# Patient Record
Sex: Male | Born: 1965 | Hispanic: No | Marital: Married | Smoking: Never smoker
Health system: Southern US, Community
[De-identification: ages and names within clinical notes are randomized; demographics above are authoritative.]

## PROBLEM LIST (undated history)

## (undated) DIAGNOSIS — T7840XA Allergy, unspecified, initial encounter: Secondary | ICD-10-CM

## (undated) HISTORY — DX: Allergy, unspecified, initial encounter: T78.40XA

---

## 1998-07-12 ENCOUNTER — Other Ambulatory Visit: Admission: RE | Admit: 1998-07-12 | Discharge: 1998-07-12 | Payer: Self-pay | Admitting: Internal Medicine

## 2005-07-24 ENCOUNTER — Ambulatory Visit (HOSPITAL_COMMUNITY): Admission: RE | Admit: 2005-07-24 | Discharge: 2005-07-24 | Payer: Self-pay | Admitting: Internal Medicine

## 2005-07-24 ENCOUNTER — Encounter (INDEPENDENT_AMBULATORY_CARE_PROVIDER_SITE_OTHER): Payer: Self-pay | Admitting: *Deleted

## 2006-08-11 IMAGING — US US BIOPSY
1 series · 12 of 12 positions shown · non-contrast
Comparison: none

CLINICAL DATA: ULTRASOUND-GUIDED THYROID BIOPSY:
An ultrasound-guided thyroid biopsy was thoroughly discussed with the patient and questions were answered.  Risks and benefits of the procedure were also delineated.  Risks specifically discussed included bleeding, bruising, infection and risk of injury to adjacent blood vessels and nerves. The patient understands and wishes to proceed.  Verbal and written consent was obtained.  
After the patient was prepped and draped in the normal sterile fashion 1% lidocaine was used for local anesthesia.  Using direct ultrasound guidance four passes were made using a 25 gauge hypodermic needle into the largest nodule within the right lobe of the thyroid.  The specimens were given to cytology for further analysis.  The patient tolerated the procedure well and there were no immediate complications.  No hematoma was identified postprocedure.

[Series 1: unknown · 0.09mm/px · 12 of 12 slices shown]
[im 1/12]
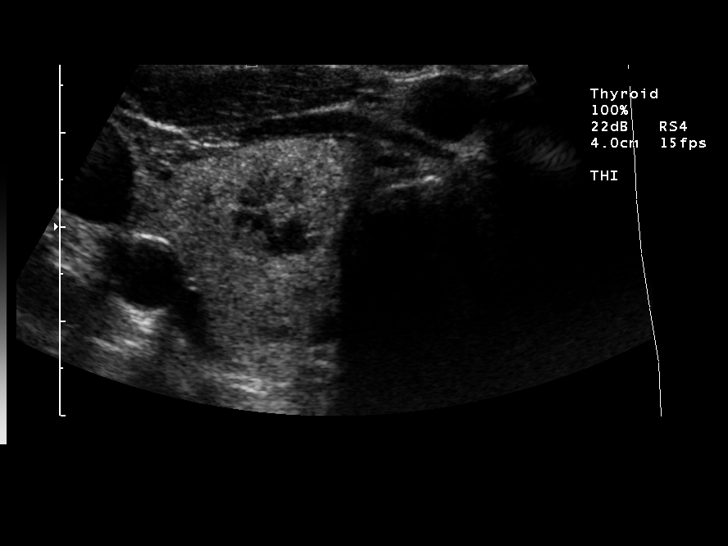
[im 2/12]
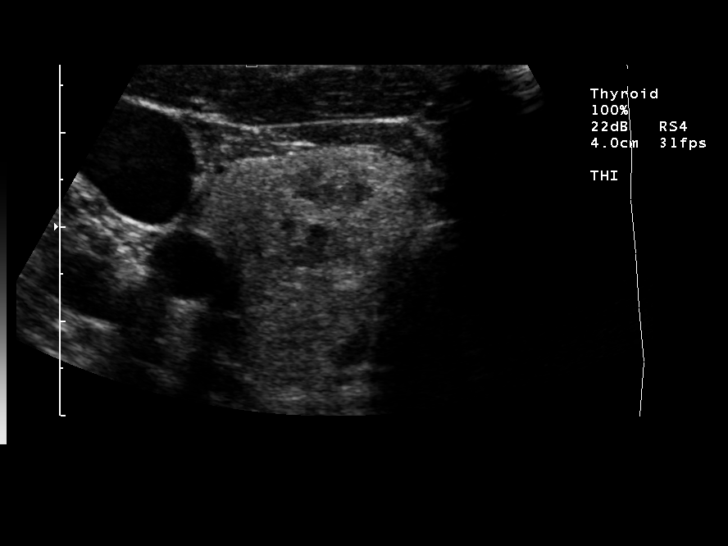
[im 3/12]
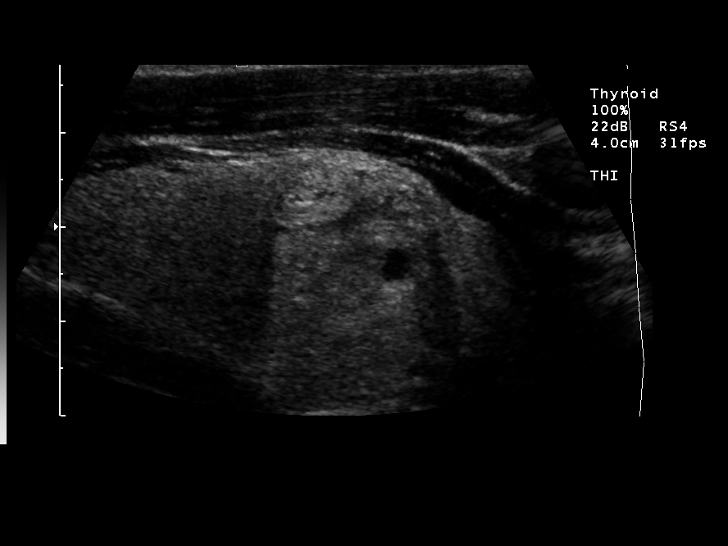
[im 4/12]
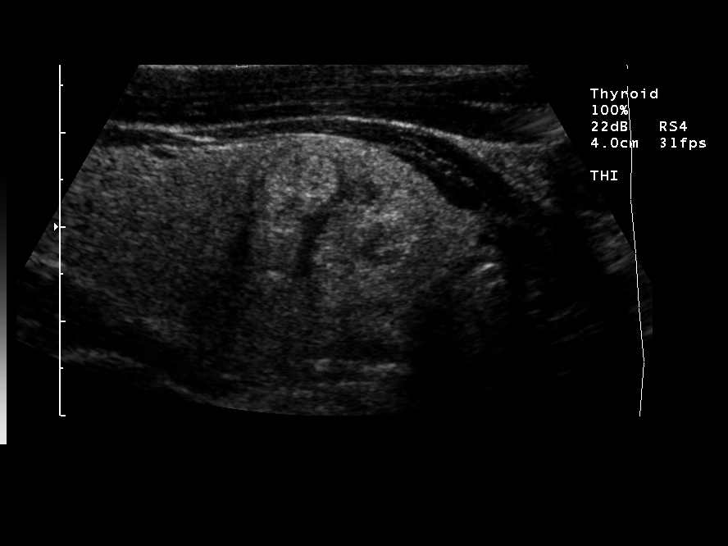
[im 5/12]
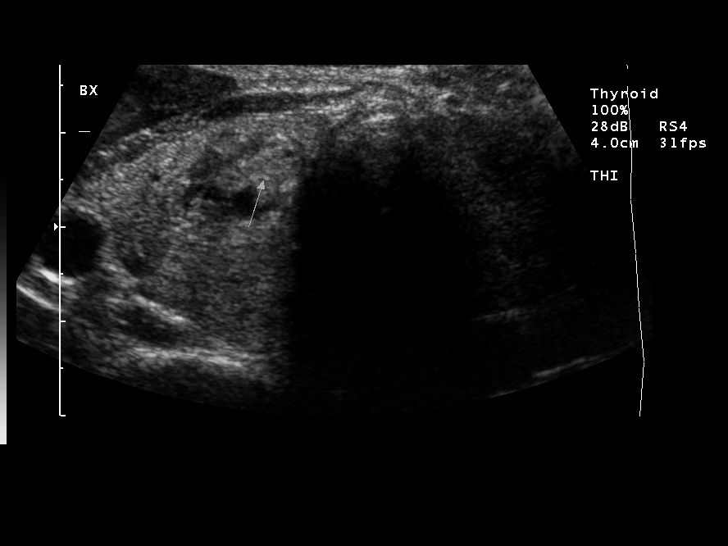
[im 6/12]
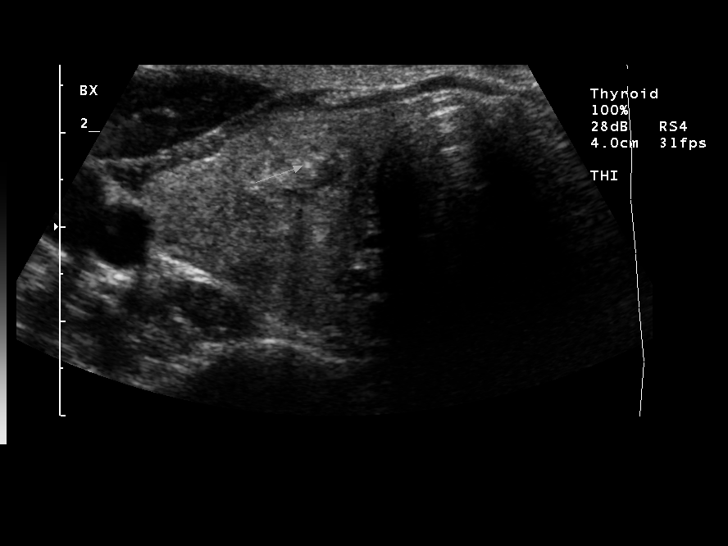
[im 7/12]
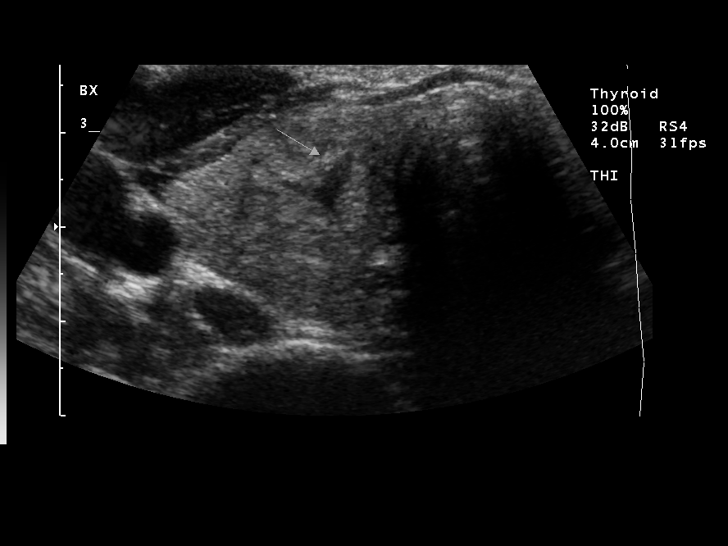
[im 8/12]
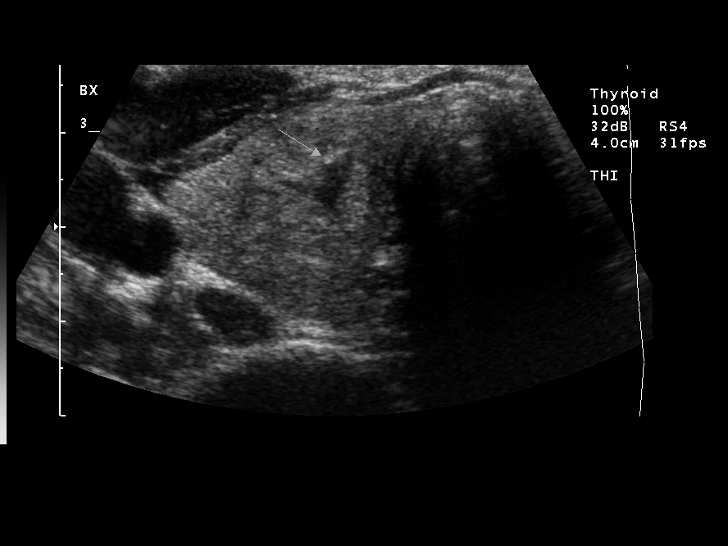
[im 9/12]
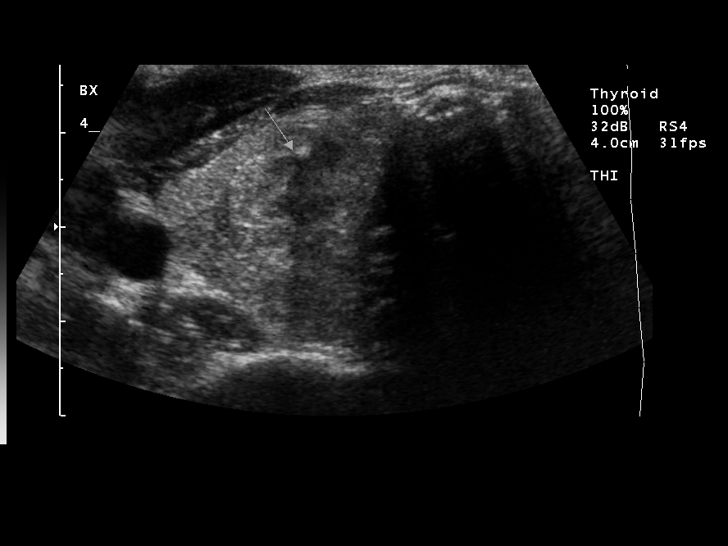
[im 10/12]
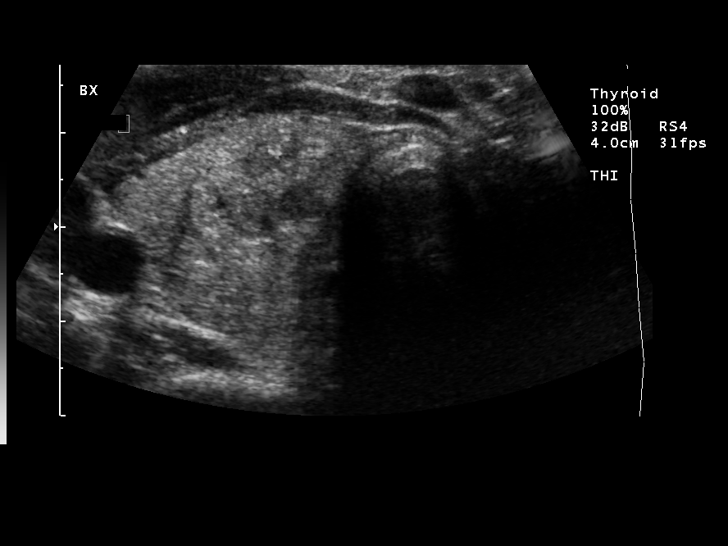
[im 11/12]
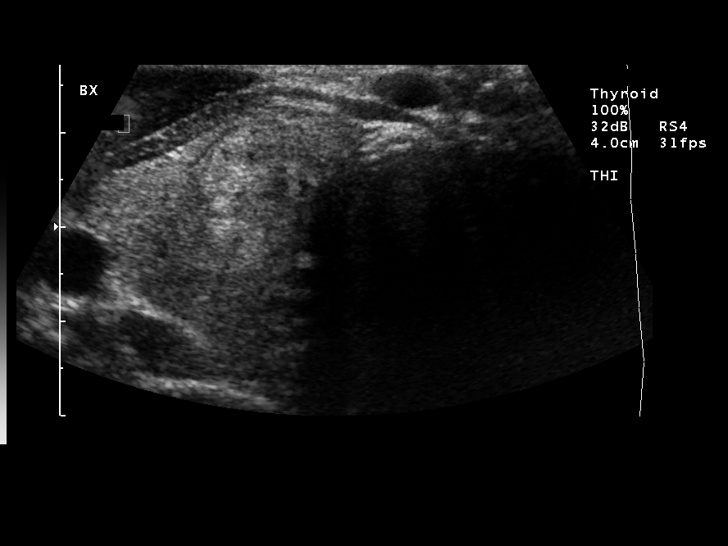
[im 12/12]
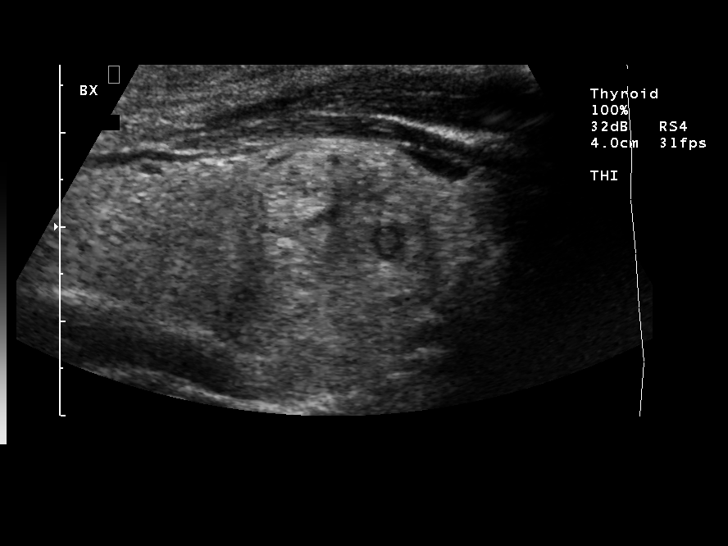

[12 of 12 positions shown; findings below may reference images not displayed]

IMPRESSION: Successful ultrasound-guided fine needle aspiration of the right lobe of the thyroid as discussed above.

## 2007-08-26 ENCOUNTER — Ambulatory Visit: Payer: Self-pay | Admitting: Internal Medicine

## 2007-08-26 LAB — CONVERTED CEMR LAB
Albumin: 3.8 g/dL (ref 3.5–5.2)
Alkaline Phosphatase: 72 units/L (ref 39–117)
Basophils Absolute: 0.1 10*3/uL (ref 0.0–0.1)
Bilirubin, Direct: 0.1 mg/dL (ref 0.0–0.3)
CO2: 29 meq/L (ref 19–32)
Calcium: 9.1 mg/dL (ref 8.4–10.5)
Chloride: 108 meq/L (ref 96–112)
Cortisol, Plasma: 4.8 ug/dL
Creatinine, Ser: 1.1 mg/dL (ref 0.4–1.5)
Eosinophils Absolute: 1 10*3/uL — ABNORMAL HIGH (ref 0.0–0.6)
Eosinophils Relative: 15.4 % — ABNORMAL HIGH (ref 0.0–5.0)
GFR calc Af Amer: 95 mL/min
Hemoglobin: 14.2 g/dL (ref 13.0–17.0)
Ketones, ur: NEGATIVE mg/dL
Leukocytes, UA: NEGATIVE
Lymphocytes Relative: 34.9 % (ref 12.0–46.0)
MCHC: 35.1 g/dL (ref 30.0–36.0)
MCV: 86.4 fL (ref 78.0–100.0)
Monocytes Absolute: 0.3 10*3/uL (ref 0.2–0.7)
Neutro Abs: 2.9 10*3/uL (ref 1.4–7.7)
Neutrophils Relative %: 43.7 % (ref 43.0–77.0)
Nitrite: NEGATIVE
Platelets: 186 10*3/uL (ref 150–400)
RBC: 4.67 M/uL (ref 4.22–5.81)
Sed Rate: 11 mm/hr (ref 0–20)
TSH: 0.58 microintl units/mL (ref 0.35–5.50)
Urine Glucose: 100 mg/dL — AB
Urobilinogen, UA: 0.2 (ref 0.0–1.0)
pH: 5.5 (ref 5.0–8.0)

## 2007-10-28 ENCOUNTER — Encounter: Payer: Self-pay | Admitting: Internal Medicine

## 2007-11-25 ENCOUNTER — Encounter (INDEPENDENT_AMBULATORY_CARE_PROVIDER_SITE_OTHER): Payer: Self-pay | Admitting: *Deleted

## 2007-11-25 ENCOUNTER — Ambulatory Visit: Payer: Self-pay | Admitting: Internal Medicine

## 2007-11-25 DIAGNOSIS — M549 Dorsalgia, unspecified: Secondary | ICD-10-CM | POA: Insufficient documentation

## 2007-11-25 DIAGNOSIS — H547 Unspecified visual loss: Secondary | ICD-10-CM

## 2007-11-25 DIAGNOSIS — R7989 Other specified abnormal findings of blood chemistry: Secondary | ICD-10-CM | POA: Insufficient documentation

## 2007-11-26 LAB — CONVERTED CEMR LAB
Basophils Absolute: 0.1 10*3/uL (ref 0.0–0.1)
Basophils Relative: 1.1 % — ABNORMAL HIGH (ref 0.0–1.0)
Eosinophils Absolute: 0.4 10*3/uL (ref 0.0–0.6)
Eosinophils Relative: 6.1 % — ABNORMAL HIGH (ref 0.0–5.0)
Monocytes Absolute: 0.3 10*3/uL (ref 0.2–0.7)
Neutro Abs: 3.4 10*3/uL (ref 1.4–7.7)
Platelets: 199 10*3/uL (ref 150–400)

## 2007-11-28 ENCOUNTER — Encounter: Payer: Self-pay | Admitting: Internal Medicine

## 2007-11-28 DIAGNOSIS — E559 Vitamin D deficiency, unspecified: Secondary | ICD-10-CM

## 2007-11-30 ENCOUNTER — Encounter: Payer: Self-pay | Admitting: Internal Medicine

## 2008-03-31 ENCOUNTER — Telehealth: Payer: Self-pay | Admitting: Internal Medicine

## 2008-03-31 ENCOUNTER — Ambulatory Visit: Payer: Self-pay | Admitting: Internal Medicine

## 2008-03-31 DIAGNOSIS — M79609 Pain in unspecified limb: Secondary | ICD-10-CM

## 2008-03-31 DIAGNOSIS — R071 Chest pain on breathing: Secondary | ICD-10-CM | POA: Insufficient documentation

## 2008-04-22 DIAGNOSIS — E042 Nontoxic multinodular goiter: Secondary | ICD-10-CM | POA: Insufficient documentation

## 2008-09-05 ENCOUNTER — Ambulatory Visit: Payer: Self-pay | Admitting: Internal Medicine

## 2008-09-05 LAB — CONVERTED CEMR LAB
ALT: 28 units/L (ref 0–53)
AST: 26 units/L (ref 0–37)
BUN: 17 mg/dL (ref 6–23)
Bilirubin, Direct: 0.1 mg/dL (ref 0.0–0.3)
Calcium: 9.2 mg/dL (ref 8.4–10.5)
Chloride: 112 meq/L (ref 96–112)
Cholesterol: 176 mg/dL (ref 0–200)
Eosinophils Relative: 7 % — ABNORMAL HIGH (ref 0.0–5.0)
HCT: 42.9 % (ref 39.0–52.0)
Hemoglobin: 15 g/dL (ref 13.0–17.0)
LDL Cholesterol: 129 mg/dL — ABNORMAL HIGH (ref 0–99)
Leukocytes, UA: NEGATIVE
MCHC: 35.1 g/dL (ref 30.0–36.0)
MCV: 88 fL (ref 78.0–100.0)
Monocytes Relative: 6 % (ref 3.0–12.0)
Nitrite: NEGATIVE
Platelets: 192 10*3/uL (ref 150–400)
RBC: 4.87 M/uL (ref 4.22–5.81)
RDW: 12.5 % (ref 11.5–14.6)
Sodium: 143 meq/L (ref 135–145)
Specific Gravity, Urine: 1.025 (ref 1.000–1.03)
Total CHOL/HDL Ratio: 5.4
Total Protein, Urine: NEGATIVE mg/dL
Total Protein: 7.2 g/dL (ref 6.0–8.3)
Urobilinogen, UA: 0.2 (ref 0.0–1.0)
WBC: 5.2 10*3/uL (ref 4.5–10.5)

## 2008-09-09 ENCOUNTER — Ambulatory Visit: Payer: Self-pay | Admitting: Internal Medicine

## 2008-09-28 ENCOUNTER — Ambulatory Visit: Payer: Self-pay | Admitting: Internal Medicine

## 2008-09-28 DIAGNOSIS — M25519 Pain in unspecified shoulder: Secondary | ICD-10-CM | POA: Insufficient documentation

## 2008-09-28 DIAGNOSIS — J069 Acute upper respiratory infection, unspecified: Secondary | ICD-10-CM | POA: Insufficient documentation

## 2008-10-19 ENCOUNTER — Ambulatory Visit: Payer: Self-pay | Admitting: Internal Medicine

## 2009-11-10 ENCOUNTER — Ambulatory Visit: Payer: Self-pay | Admitting: Internal Medicine

## 2009-11-10 LAB — CONVERTED CEMR LAB
AST: 34 units/L (ref 0–37)
Albumin: 4.1 g/dL (ref 3.5–5.2)
Alkaline Phosphatase: 71 units/L (ref 39–117)
CO2: 30 meq/L (ref 19–32)
Chloride: 107 meq/L (ref 96–112)
Cholesterol: 207 mg/dL — ABNORMAL HIGH (ref 0–200)
Creatinine, Ser: 0.9 mg/dL (ref 0.4–1.5)
HDL: 42 mg/dL (ref >39.00)
Hemoglobin: 15.3 g/dL (ref 13.0–17.0)
Ketones, ur: NEGATIVE mg/dL
Leukocytes, UA: NEGATIVE
MCHC: 34.3 g/dL (ref 30.0–36.0)
MCV: 90.4 fL (ref 78.0–100.0)
Monocytes Absolute: 0.3 10*3/uL (ref 0.1–1.0)
Neutrophils Relative %: 56.6 % (ref 43.0–77.0)
Platelets: 181 10*3/uL (ref 150.0–400.0)
Potassium: 4.1 meq/L (ref 3.5–5.1)
RBC: 4.94 M/uL (ref 4.22–5.81)
RDW: 12.3 % (ref 11.5–14.6)
Sodium: 141 meq/L (ref 135–145)
Specific Gravity, Urine: 1.01 (ref 1.000–1.030)
TSH: 1.12 microintl units/mL (ref 0.35–5.50)
Total Bilirubin: 1.3 mg/dL — ABNORMAL HIGH (ref 0.3–1.2)
Total CHOL/HDL Ratio: 5
Urine Glucose: NEGATIVE mg/dL
pH: 7 (ref 5.0–8.0)

## 2009-11-14 ENCOUNTER — Ambulatory Visit: Payer: Self-pay | Admitting: Internal Medicine

## 2010-10-25 ENCOUNTER — Encounter: Payer: Self-pay | Admitting: Internal Medicine

## 2010-11-01 ENCOUNTER — Encounter: Payer: Self-pay | Admitting: Internal Medicine

## 2011-01-20 LAB — CONVERTED CEMR LAB: LDL Goal: 160 mg/dL

## 2011-01-22 NOTE — Letter (Signed)
Summary: [facility]   Imported By: Sherian Rein 11/28/2010 11:21:20  _____________________________________________________________________  External Attachment:    Type:   Image     Comment:   External Document

## 2011-05-07 NOTE — Assessment & Plan Note (Signed)
[facility]                           PRIMARY CARE OFFICE NOTE   NAME:Andrew Salazar, Andrew Salazar                      MRN:          [id number]  DATE:08/26/2007                            DOB:          02-10-1966    The patient is a 45 year old male who presents for a wellness  examination. He is a new patient. He complains of feeling tired of one  month duration since the time he came back from [place] where he spent  about a month.   ALLERGIES:  None.   CURRENT MEDICATIONS:  None.   FAMILY HISTORY:  Father died with prostate cancer in his 72s, mother is  living. No history of diabetes in the family.   SOCIAL HISTORY:  He is married with 2 children, ages 79 daughter and 4  son. He is an Chief Financial Officer working first shift.   REVIEW OF SYSTEMS:  Tired, fatigued, generalized ache for a month. No  diarrhea or blood in the stool. No leg swelling, chest pain or cough.  The rest of his 18-point review of systems is negative.   PHYSICAL EXAMINATION:  VITAL SIGNS:  Blood pressure 97/62, pulse 58,  temperature 97.8, weight 161 pounds.  GENERAL:  He is in no acute distress, looks well.  HEENT:  Moist mucosa.  NECK:  Supple. No thyromegaly or bruit.  LUNGS:  Clear to auscultation and percussion. No wheezes or rales.  HEART:  S1, S2, no murmur, no gallop.  ABDOMEN:  Soft, nontender, no organomegaly or mass felt.  EXTREMITIES:  Lower extremity without edema.  NEUROLOGIC:  He is alert, oriented and cooperative, denies being  depressed.  PROSTATE:  Normal prostate. Stool guaiac negative. No masses.   EKG with normal sinus rhythm.   ASSESSMENT/PLAN:  1. Normal wellness examination. Age/health-related issues discussed.      Healthy lifestyle discussed. Obtain blood work appropriate for age      with a PSA test. All vaccinations are up-to-date per patient.  2. Fatigue and arthralgias of unclear etiology. Will need to rule out      a general illness including  paracytic disease. Obtain the usual      blood work and chest x-ray.  3. Family history of prostate cancer. Plan as above.  4. Osteoarthritis. Vitamin D3 1000 units daily.   I will see him back in 2 months. He will call me if there are any  problems.     Georgina Quint. Plotnikov, MD  Electronically Signed    AVP/MedQ  DD: 08/31/2007  DT: 09/01/2007  Job #: [id number]

## 2011-05-17 ENCOUNTER — Ambulatory Visit (INDEPENDENT_AMBULATORY_CARE_PROVIDER_SITE_OTHER): Payer: Managed Care, Other (non HMO) | Admitting: Internal Medicine

## 2011-05-17 ENCOUNTER — Encounter: Payer: Self-pay | Admitting: Internal Medicine

## 2011-05-17 ENCOUNTER — Ambulatory Visit: Admission: RE | Admit: 2011-05-17 | Payer: Managed Care, Other (non HMO) | Source: Ambulatory Visit

## 2011-05-17 VITALS — BP 120/76 | HR 76 | Temp 98.4°F | Resp 16 | Ht 66.5 in | Wt 164.0 lb

## 2011-05-17 DIAGNOSIS — J189 Pneumonia, unspecified organism: Secondary | ICD-10-CM

## 2011-05-17 MED ORDER — PROMETHAZINE-CODEINE 6.25-10 MG/5ML PO SYRP: 5.0000 mL | ORAL_SOLUTION | ORAL | Status: AC | PRN

## 2011-05-17 MED ORDER — BENZONATATE 100 MG PO CAPS
100.0000 mg | ORAL_CAPSULE | Freq: Four times a day (QID) | ORAL | Status: DC | PRN
Start: 2011-05-17 — End: 2011-12-19

## 2011-05-17 MED ORDER — MOXIFLOXACIN HCL 400 MG PO TABS: 400.0000 mg | ORAL_TABLET | Freq: Every day | ORAL | Status: AC

## 2011-05-17 NOTE — Progress Notes (Signed)
  Subjective:    Patient ID: Andrew Salazar, male    DOB: 1966-11-29, 45 y.o.   MRN: [id number]  HPI   HPI  C/o URI sx's x   days. C/o ST, cough, weakness. Not better with OTC medicines. Actually, the patient is getting worse. The patient did not sleep last night due to cough.  Review of Systems  Constitutional: Positive for fever, chills and fatigue.  HENT: Positive for congestion, rhinorrhea, sneezing and postnasal drip.   Eyes: Positive for photophobia and pain. Negative for discharge and visual disturbance.  Respiratory: Positive for cough and wheezing.   Positive for chest pain.  Gastrointestinal: Negative for vomiting, abdominal pain, diarrhea and abdominal distention.  Genitourinary: Negative for dysuria and difficulty urinating.  Skin: Negative for rash.  Neurological: Positive for dizziness, weakness and light-headedness.      Review of Systems     Objective:   Physical Exam  Constitutional: He is oriented to person, place, and time. He appears well-developed.  HENT:  Mouth/Throat: Oropharynx is clear and moist. No oropharyngeal exudate.       Eryth throat  Eyes: Conjunctivae are normal. Pupils are equal, round, and reactive to light.  Neck: Normal range of motion. No JVD present. No thyromegaly present.  Cardiovascular: Normal rate, regular rhythm, normal heart sounds and intact distal pulses.  Exam reveals no gallop and no friction rub.   No murmur heard. Pulmonary/Chest: Effort normal. No respiratory distress. He has no wheezes. He has rales (L base). He exhibits no tenderness.  Abdominal: Soft. Bowel sounds are normal. He exhibits no distension and no mass. There is no tenderness. There is no rebound and no guarding.  Musculoskeletal: Normal range of motion. He exhibits no edema and no tenderness.  Lymphadenopathy:    He has no cervical adenopathy.  Neurological: He is alert and oriented to person, place, and time. He has normal reflexes. No cranial nerve  deficit. He exhibits normal muscle tone. Coordination normal.  Skin: Skin is warm and dry. No rash noted.  Psychiatric: He has a normal mood and affect. His behavior is normal. Judgment and thought content normal.          Assessment & Plan:  Pneumonia CXRAvelox Prom/cod Tessalon    Chest and throat pain  Rx as above

## 2011-05-17 NOTE — Patient Instructions (Signed)
Use over-the-counter  "cold" medicines  such as "Tylenol cold" , "Advil cold",  "Mucinex" or" Mucinex D"  for cough and congestion.   Avoid decongestants if you have high blood pressure and use "Afrin" nasal spray for nasal congestion as directed instead. Use" Delsym" or" Robitussin" cough syrup varietis for cough.  You can use plain "Tylenol" or "Advil" for fever, chills and achyness.  Please, make an appointment if you are not better or if you're worse.  

## 2011-05-17 NOTE — Assessment & Plan Note (Signed)
CXRAvelox Prom/cod [employer]

## 2011-12-19 ENCOUNTER — Ambulatory Visit (INDEPENDENT_AMBULATORY_CARE_PROVIDER_SITE_OTHER): Payer: Managed Care, Other (non HMO) | Admitting: Internal Medicine

## 2011-12-19 ENCOUNTER — Encounter: Payer: Self-pay | Admitting: Internal Medicine

## 2011-12-19 VITALS — BP 118/78 | HR 57 | Temp 97.6°F | Ht 68.0 in | Wt 158.1 lb

## 2011-12-19 DIAGNOSIS — J309 Allergic rhinitis, unspecified: Secondary | ICD-10-CM

## 2011-12-19 DIAGNOSIS — J209 Acute bronchitis, unspecified: Secondary | ICD-10-CM | POA: Insufficient documentation

## 2011-12-19 MED ORDER — HYDROCODONE-HOMATROPINE 5-1.5 MG/5ML PO SYRP: 5.0000 mL | ORAL_SOLUTION | Freq: Four times a day (QID) | ORAL | Status: AC | PRN

## 2011-12-19 MED ORDER — AZITHROMYCIN 250 MG PO TABS: ORAL_TABLET | ORAL | Status: AC

## 2011-12-19 NOTE — Progress Notes (Signed)
  Subjective:    Patient ID: Andrew Salazar, male    DOB: 08-31-1966, 45 y.o.   MRN: [id number]  HPI  Here with acute onset mild to mod 2-3 days ST, HA, general weakness and malaise, with prod cough greenish sputum, but Pt denies chest pain, increased sob or doe, wheezing, orthopnea, PND, increased LE swelling, palpitations, dizziness or syncope.  Does have several wks ongoing nasal allergy symptoms with clear congestion, itch and sneeze, without fever, pain, ST, cough or wheezing, all prior to the recent onset symptoms above, has occasional epistaxis as well, but better with OTC meds such as allegra which is not too drying. Past Medical History  Diagnosis Date  . Allergy    No past surgical history on file.  reports that he has never smoked. He does not have any smokeless tobacco history on file. He reports that he does not drink alcohol or use illicit drugs. family history is not on file. No Known Allergies No current outpatient prescriptions on file prior to visit.   Review of Systems Review of Systems  Constitutional: Negative for diaphoresis and unexpected weight change.  HENT: Negative for drooling and tinnitus.   Eyes: Negative for photophobia and visual disturbance.  Respiratory: Negative for choking and stridor.   Gastrointestinal: Negative for vomiting and blood in stool.  Genitourinary: Negative for hematuria and decreased urine volume.     Objective:   Physical Exam BP 118/78  Pulse 57  Temp(Src) 97.6 F (36.4 C) (Oral)  Ht 5\' 8"  (1.727 m)  Wt 158 lb 2 oz (71.725 kg)  BMI 24.04 kg/m2  SpO2 97% Physical Exam  VS noted, mild ill Constitutional: Pt appears well-developed and well-nourished.  HENT: Head: Normocephalic.  Right Ear: External ear normal.  Left Ear: External ear normal.  Eyes: Conjunctivae and EOM are normal. Pupils are equal, round, and reactive to light.  Neck: Normal range of motion. Neck supple.  Cardiovascular: Normal rate and regular rhythm.     Pulmonary/Chest: Effort normal and breath sounds some decreased but no rales or wheezing Neurological: Pt is alert. No cranial nerve deficit.  Skin: Skin is warm. No erythema.  Psychiatric: Pt behavior is normal. Thought content normal.     Assessment & Plan:

## 2011-12-19 NOTE — Assessment & Plan Note (Signed)
Ok for [employer] prn,  to f/u any worsening symptoms or concerns, consider ENT for further nosebleeds

## 2011-12-19 NOTE — Patient Instructions (Signed)
Take all new medications as prescribed Continue all other medications as before  

## 2011-12-19 NOTE — Assessment & Plan Note (Signed)
Mild to mod, for antibx course,  to f/u any worsening symptoms or concerns, for cough med as dwll

## 2012-11-06 ENCOUNTER — Encounter: Payer: Self-pay | Admitting: Internal Medicine

## 2012-11-06 ENCOUNTER — Other Ambulatory Visit (INDEPENDENT_AMBULATORY_CARE_PROVIDER_SITE_OTHER): Payer: Managed Care, Other (non HMO)

## 2012-11-06 ENCOUNTER — Ambulatory Visit (INDEPENDENT_AMBULATORY_CARE_PROVIDER_SITE_OTHER): Payer: Managed Care, Other (non HMO) | Admitting: Internal Medicine

## 2012-11-06 VITALS — BP 128/76 | HR 80 | Temp 97.6°F | Resp 16 | Ht 65.75 in | Wt 159.0 lb

## 2012-11-06 DIAGNOSIS — J310 Chronic rhinitis: Secondary | ICD-10-CM | POA: Insufficient documentation

## 2012-11-06 DIAGNOSIS — R21 Rash and other nonspecific skin eruption: Secondary | ICD-10-CM

## 2012-11-06 DIAGNOSIS — B354 Tinea corporis: Secondary | ICD-10-CM

## 2012-11-06 DIAGNOSIS — Z0000 Encounter for general adult medical examination without abnormal findings: Secondary | ICD-10-CM

## 2012-11-06 DIAGNOSIS — R5381 Other malaise: Secondary | ICD-10-CM

## 2012-11-06 DIAGNOSIS — R5383 Other fatigue: Secondary | ICD-10-CM

## 2012-11-06 LAB — LIPID PANEL
Cholesterol: 214 mg/dL — ABNORMAL HIGH (ref 0–200)
HDL: 49.6 mg/dL (ref >39.00)
Triglycerides: 80 mg/dL (ref 0.0–149.0)
VLDL: 16 mg/dL (ref 0.0–40.0)

## 2012-11-06 LAB — TSH: TSH: 0.92 u[IU]/mL (ref 0.35–5.50)

## 2012-11-06 LAB — BASIC METABOLIC PANEL
Calcium: 9.6 mg/dL (ref 8.4–10.5)
GFR: 81.6 mL/min (ref >60.00)
Glucose, Bld: 82 mg/dL (ref 70–99)

## 2012-11-06 LAB — URINALYSIS
Total Protein, Urine: NEGATIVE
Urobilinogen, UA: 1 (ref 0.0–1.0)
pH: 7.5 (ref 5.0–8.0)

## 2012-11-06 LAB — VITAMIN B12: Vitamin B-12: 311 pg/mL (ref 211–911)

## 2012-11-06 LAB — HEPATIC FUNCTION PANEL
ALT: 28 U/L (ref 0–53)
AST: 34 U/L (ref 0–37)

## 2012-11-06 MED ORDER — VITAMIN D 1000 UNITS PO TABS: 1000.0000 [IU] | ORAL_TABLET | Freq: Every day | ORAL | Status: AC

## 2012-11-06 MED ORDER — FLUTICASONE PROPIONATE 50 MCG/ACT NA SUSP: 2.0000 | Freq: Every day | NASAL | Status: DC

## 2012-11-06 MED ORDER — CLOTRIMAZOLE-BETAMETHASONE 1-0.05 % EX CREA: TOPICAL_CREAM | Freq: Two times a day (BID) | CUTANEOUS | Status: DC

## 2012-11-06 MED ORDER — KETOCONAZOLE 200 MG PO TABS: 200.0000 mg | ORAL_TABLET | Freq: Every day | ORAL | Status: DC

## 2012-11-06 NOTE — Progress Notes (Signed)
  Subjective:    Patient ID: Andrew Salazar, male    DOB: [birth year], 46 y.o.   MRN: [id number]  HPI  The patient is here for a wellness exam. The patient has been doing well overall without major physical or psychological issues going on lately, except for rash x months: 2 types  C/o rash x months  Wt Readings from Last 3 Encounters:  11/06/12 159 lb (72.122 kg)  12/19/11 158 lb 2 oz (71.725 kg)  05/17/11 164 lb (74.39 kg)   BP Readings from Last 3 Encounters:  11/06/12 128/76  12/19/11 118/78  05/17/11 120/76       Review of Systems  Constitutional: Negative for appetite change, fatigue and unexpected weight change.  HENT: Negative for nosebleeds, congestion, sore throat, sneezing, trouble swallowing and neck pain.   Eyes: Negative for itching and visual disturbance.  Respiratory: Negative for cough.   Cardiovascular: Negative for chest pain, palpitations and leg swelling.  Gastrointestinal: Negative for nausea, diarrhea, blood in stool and abdominal distention.  Genitourinary: Negative for frequency and hematuria.  Musculoskeletal: Negative for back pain, joint swelling and gait problem.  Skin: Positive for rash.  Neurological: Negative for dizziness, tremors, speech difficulty and weakness.  Psychiatric/Behavioral: Negative for sleep disturbance, dysphoric mood and agitation. The patient is not nervous/anxious.        Objective:   Physical Exam  Constitutional: He is oriented to person, place, and time. He appears well-developed.  HENT:  Mouth/Throat: Oropharynx is clear and moist.  Eyes: Conjunctivae normal are normal. Pupils are equal, round, and reactive to light.  Neck: Normal range of motion. No JVD present. No thyromegaly present.  Cardiovascular: Normal rate, regular rhythm, normal heart sounds and intact distal pulses.  Exam reveals no gallop and no friction rub.   No murmur heard. Pulmonary/Chest: Effort normal and breath sounds normal. No respiratory  distress. He has no wheezes. He has no rales. He exhibits no tenderness.  Abdominal: Soft. Bowel sounds are normal. He exhibits no distension and no mass. There is no tenderness. There is no rebound and no guarding.  Musculoskeletal: Normal range of motion. He exhibits no edema and no tenderness.  Lymphadenopathy:    He has no cervical adenopathy.  Neurological: He is alert and oriented to person, place, and time. He has normal reflexes. No cranial nerve deficit. He exhibits normal muscle tone. Coordination normal.  Skin: Skin is warm and dry. Rash noted.       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 His behavior is normal. Judgment and thought content normal.   Lab Results  Component Value Date   WBC 5.5 11/10/2009   HGB 15.3 11/10/2009   HCT 44.7 11/10/2009   PLT 181.0 11/10/2009   GLUCOSE 82 11/06/2012   CHOL 214* 11/06/2012   TRIG 80.0 11/06/2012   HDL 49.60 11/06/2012   LDLDIRECT 144.2 11/06/2012   LDLCALC 129* 09/05/2008   ALT 28 11/06/2012   AST 34 11/06/2012   NA 138 11/06/2012   K 3.9 11/06/2012   CL 107 11/06/2012   CREATININE 1.0 11/06/2012   BUN 17 11/06/2012   CO2 26 11/06/2012   TSH 0.92 11/06/2012   PSA 1.90 11/10/2009          Assessment & Plan:

## 2012-11-06 NOTE — Assessment & Plan Note (Signed)
Labs

## 2012-11-06 NOTE — Assessment & Plan Note (Signed)
Ketoconazole x 2-3 mo Lotrisone

## 2012-11-07 LAB — VITAMIN D 25 HYDROXY (VIT D DEFICIENCY, FRACTURES): Vit D, 25-Hydroxy: 32 ng/mL (ref 30–89)

## 2012-11-07 NOTE — Assessment & Plan Note (Signed)
We discussed age appropriate health related issues, including available/recomended screening tests and vaccinations. We discussed a need for adhering to healthy diet and exercise. Labs/EKG were reviewed/ordered. All questions were answered.   

## 2012-11-07 NOTE — Assessment & Plan Note (Signed)
Flonase nasal spray °

## 2012-11-07 NOTE — Assessment & Plan Note (Signed)
As above.

## 2012-11-12 ENCOUNTER — Encounter: Payer: Self-pay | Admitting: *Deleted

## 2013-04-22 ENCOUNTER — Telehealth: Payer: Self-pay | Admitting: Internal Medicine

## 2013-04-22 NOTE — Telephone Encounter (Signed)
Patient Information:  Caller Name: Ashvin  Phone: [phone]  Patient: Andrew Salazar, Andrew Salazar  Gender: Male  DOB: 03/24/1966  Age: 47 Years  PCP: Plotnikov, Alex (Adults only)  Office Follow Up:  Does the office need to follow up with this patient?: No  Instructions For The Office: N/A  RN Note:  Cough is non productive. When advised PCP not available, refuses appointment with another provider. States will use Mucinex until "next week" and if not improving, call back.  Symptoms  Reason For Call & Symptoms: Has cough since 4-29.  Reviewed Health History In EMR: Yes  Reviewed Medications In EMR: Yes  Reviewed Allergies In EMR: Yes  Reviewed Surgeries / Procedures: Yes  Date of Onset of Symptoms: 04/20/2013  Guideline(s) Used:  Cough  Disposition Per Guideline:   See Today or Tomorrow in Office  Reason For Disposition Reached:   Patient wants to be seen  Advice Given:  N/A  Patient Will Follow Care Advice:  YES

## 2013-07-09 ENCOUNTER — Ambulatory Visit (INDEPENDENT_AMBULATORY_CARE_PROVIDER_SITE_OTHER): Payer: PRIVATE HEALTH INSURANCE | Admitting: Internal Medicine

## 2013-07-09 ENCOUNTER — Encounter: Payer: Self-pay | Admitting: Internal Medicine

## 2013-07-09 ENCOUNTER — Other Ambulatory Visit (INDEPENDENT_AMBULATORY_CARE_PROVIDER_SITE_OTHER): Payer: PRIVATE HEALTH INSURANCE

## 2013-07-09 VITALS — BP 102/82 | HR 46 | Temp 97.6°F | Wt 154.8 lb

## 2013-07-09 DIAGNOSIS — R5381 Other malaise: Secondary | ICD-10-CM

## 2013-07-09 DIAGNOSIS — R5383 Other fatigue: Secondary | ICD-10-CM

## 2013-07-09 DIAGNOSIS — H11009 Unspecified pterygium of unspecified eye: Secondary | ICD-10-CM

## 2013-07-09 DIAGNOSIS — H11001 Unspecified pterygium of right eye: Secondary | ICD-10-CM

## 2013-07-09 DIAGNOSIS — E785 Hyperlipidemia, unspecified: Secondary | ICD-10-CM

## 2013-07-09 DIAGNOSIS — J310 Chronic rhinitis: Secondary | ICD-10-CM

## 2013-07-09 DIAGNOSIS — E559 Vitamin D deficiency, unspecified: Secondary | ICD-10-CM

## 2013-07-09 LAB — HEPATIC FUNCTION PANEL
ALT: 22 U/L (ref 0–53)
AST: 21 U/L (ref 0–37)
Albumin: 3.9 g/dL (ref 3.5–5.2)
Alkaline Phosphatase: 73 U/L (ref 39–117)
Total Protein: 7 g/dL (ref 6.0–8.3)

## 2013-07-09 LAB — CBC WITH DIFFERENTIAL/PLATELET
Basophils Relative: 0.5 % (ref 0.0–3.0)
Eosinophils Absolute: 0.3 10*3/uL (ref 0.0–0.7)
Lymphocytes Relative: 34 % (ref 12.0–46.0)
MCHC: 34.9 g/dL (ref 30.0–36.0)
Monocytes Relative: 5.5 % (ref 3.0–12.0)
Neutrophils Relative %: 53.8 % (ref 43.0–77.0)
RBC: 4.8 Mil/uL (ref 4.22–5.81)
WBC: 5.4 10*3/uL (ref 4.5–10.5)

## 2013-07-09 LAB — URINALYSIS
Bilirubin Urine: NEGATIVE
Hgb urine dipstick: NEGATIVE
Ketones, ur: NEGATIVE
Total Protein, Urine: NEGATIVE
pH: 6 (ref 5.0–8.0)

## 2013-07-09 LAB — BASIC METABOLIC PANEL
Calcium: 9.2 mg/dL (ref 8.4–10.5)
Creatinine, Ser: 0.9 mg/dL (ref 0.4–1.5)
GFR: 96.14 mL/min (ref >60.00)

## 2013-07-09 LAB — LIPID PANEL
Cholesterol: 190 mg/dL (ref 0–200)
LDL Cholesterol: 133 mg/dL — ABNORMAL HIGH (ref 0–99)
Triglycerides: 65 mg/dL (ref 0.0–149.0)

## 2013-07-09 NOTE — Assessment & Plan Note (Addendum)
Symptomatic - Ophth ref

## 2013-07-09 NOTE — Assessment & Plan Note (Signed)
Continue with current prescription therapy as reflected on the Med list. Labs  

## 2013-07-09 NOTE — Progress Notes (Signed)
   Subjective:     HPI    C/o rash x months C/o R eye redness C/o tiredness x months C/o allergies  Wt Readings from Last 3 Encounters:  07/09/13 154 lb 12.8 oz (70.217 kg)  11/06/12 159 lb (72.122 kg)  12/19/11 158 lb 2 oz (71.725 kg)   BP Readings from Last 3 Encounters:  07/09/13 102/82  11/06/12 128/76  12/19/11 118/78       Review of Systems  Constitutional: Negative for appetite change, fatigue and unexpected weight change.  HENT: Negative for nosebleeds, congestion, sore throat, sneezing, trouble swallowing and neck pain.   Eyes: Negative for itching and visual disturbance.  Respiratory: Negative for cough.   Cardiovascular: Negative for chest pain, palpitations and leg swelling.  Gastrointestinal: Negative for nausea, diarrhea, blood in stool and abdominal distention.  Genitourinary: Negative for frequency and hematuria.  Musculoskeletal: Negative for back pain, joint swelling and gait problem.  Skin: Positive for rash.  Neurological: Negative for dizziness, tremors, speech difficulty and weakness.  Psychiatric/Behavioral: Negative for sleep disturbance, dysphoric mood and agitation. The patient is not nervous/anxious.        Objective:   Physical Exam  Constitutional: He is oriented to person, place, and time. He appears well-developed.  HENT:  Mouth/Throat: Oropharynx is clear and moist.  Eyes: Conjunctivae are normal. Pupils are equal, round, and reactive to light.  Neck: Normal range of motion. No JVD present. No thyromegaly present.  Cardiovascular: Normal rate, regular rhythm, normal heart sounds and intact distal pulses.  Exam reveals no gallop and no friction rub.   No murmur heard. Pulmonary/Chest: Effort normal and breath sounds normal. No respiratory distress. He has no wheezes. He has no rales. He exhibits no tenderness.  Abdominal: Soft. Bowel sounds are normal. He exhibits no distension and no mass. There is no tenderness. There is no rebound  and no guarding.  Musculoskeletal: Normal range of motion. He exhibits no edema and no tenderness.  Lymphadenopathy:    He has no cervical adenopathy.  Neurological: He is alert and oriented to person, place, and time. He has normal reflexes. No cranial nerve deficit. He exhibits normal muscle tone. Coordination normal.  Skin: Skin is warm and dry. Rash noted.  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 His behavior is normal. Judgment and thought content normal.  R eye pterygium  Lab Results  Component Value Date   WBC 5.5 11/10/2009   HGB 15.3 11/10/2009   HCT 44.7 11/10/2009   PLT 181.0 11/10/2009   GLUCOSE 82 11/06/2012   CHOL 214* 11/06/2012   TRIG 80.0 11/06/2012   HDL 49.60 11/06/2012   LDLDIRECT 144.2 11/06/2012   LDLCALC 129* 09/05/2008   ALT 28 11/06/2012   AST 34 11/06/2012   NA 138 11/06/2012   K 3.9 11/06/2012   CL 107 11/06/2012   CREATININE 1.0 11/06/2012   BUN 17 11/06/2012   CO2 26 11/06/2012   TSH 0.92 11/06/2012   PSA 1.90 11/10/2009          Assessment & Plan:

## 2013-07-09 NOTE — Assessment & Plan Note (Signed)
Continue with claritin prn

## 2013-07-09 NOTE — Patient Instructions (Signed)
Power nap Milk free trial (no milk, ice cream and yogurt) x4 weeks. OK to use almond or soy milk.

## 2013-07-09 NOTE — Assessment & Plan Note (Addendum)
?  etiology Power nap Labs

## 2013-10-28 ENCOUNTER — Other Ambulatory Visit: Payer: Self-pay

## 2014-02-07 ENCOUNTER — Ambulatory Visit: Payer: PRIVATE HEALTH INSURANCE | Admitting: Internal Medicine

## 2014-02-14 ENCOUNTER — Ambulatory Visit (INDEPENDENT_AMBULATORY_CARE_PROVIDER_SITE_OTHER): Payer: PRIVATE HEALTH INSURANCE

## 2014-02-14 ENCOUNTER — Encounter: Payer: Self-pay | Admitting: Internal Medicine

## 2014-02-14 ENCOUNTER — Ambulatory Visit (INDEPENDENT_AMBULATORY_CARE_PROVIDER_SITE_OTHER): Payer: PRIVATE HEALTH INSURANCE | Admitting: Internal Medicine

## 2014-02-14 ENCOUNTER — Ambulatory Visit (INDEPENDENT_AMBULATORY_CARE_PROVIDER_SITE_OTHER)
Admission: RE | Admit: 2014-02-14 | Discharge: 2014-02-14 | Disposition: A | Discharge status: Home / Self Care | Payer: PRIVATE HEALTH INSURANCE | Source: Ambulatory Visit | Attending: Internal Medicine | Admitting: Internal Medicine

## 2014-02-14 VITALS — BP 120/68 | HR 72 | Temp 97.8°F | Resp 16 | Wt 161.0 lb

## 2014-02-14 DIAGNOSIS — R21 Rash and other nonspecific skin eruption: Secondary | ICD-10-CM

## 2014-02-14 DIAGNOSIS — R5383 Other fatigue: Secondary | ICD-10-CM

## 2014-02-14 DIAGNOSIS — E559 Vitamin D deficiency, unspecified: Secondary | ICD-10-CM

## 2014-02-14 DIAGNOSIS — Z0000 Encounter for general adult medical examination without abnormal findings: Secondary | ICD-10-CM

## 2014-02-14 DIAGNOSIS — R5381 Other malaise: Secondary | ICD-10-CM

## 2014-02-14 LAB — CBC WITH DIFFERENTIAL/PLATELET
BASOS ABS: 0 10*3/uL (ref 0.0–0.1)
Basophils Relative: 0.4 % (ref 0.0–3.0)
EOS ABS: 0.3 10*3/uL (ref 0.0–0.7)
EOS PCT: 4.1 % (ref 0.0–5.0)
HCT: 45.8 % (ref 39.0–52.0)
Hemoglobin: 15.1 g/dL (ref 13.0–17.0)
Lymphocytes Relative: 35.8 % (ref 12.0–46.0)
Lymphs Abs: 2.4 10*3/uL (ref 0.7–4.0)
MCHC: 33 g/dL (ref 30.0–36.0)
MCV: 90.9 fl (ref 78.0–100.0)
MONO ABS: 0.3 10*3/uL (ref 0.1–1.0)
Monocytes Relative: 4.2 % (ref 3.0–12.0)
NEUTROS PCT: 55.5 % (ref 43.0–77.0)
Neutro Abs: 3.7 10*3/uL (ref 1.4–7.7)
Platelets: 220 10*3/uL (ref 150.0–400.0)
RBC: 5.03 Mil/uL (ref 4.22–5.81)
RDW: 12.9 % (ref 11.5–14.6)
WBC: 6.8 10*3/uL (ref 4.5–10.5)

## 2014-02-14 LAB — URINALYSIS
Bilirubin Urine: NEGATIVE
KETONES UR: NEGATIVE
Leukocytes, UA: NEGATIVE
Nitrite: NEGATIVE
Specific Gravity, Urine: 1.02 (ref 1.000–1.030)
TOTAL PROTEIN, URINE-UPE24: NEGATIVE
URINE GLUCOSE: NEGATIVE
UROBILINOGEN UA: 0.2 (ref 0.0–1.0)
pH: 7 (ref 5.0–8.0)

## 2014-02-14 LAB — HIV ANTIBODY (ROUTINE TESTING W REFLEX): HIV: NONREACTIVE

## 2014-02-14 MED ORDER — AMOXICILLIN-POT CLAVULANATE 875-125 MG PO TABS: 1.0000 | ORAL_TABLET | Freq: Two times a day (BID) | ORAL | Status: DC

## 2014-02-14 MED ORDER — METHYLPREDNISOLONE ACETATE 80 MG/ML IJ SUSP
80.0000 mg | Freq: Once | INTRAMUSCULAR | Status: AC
  Administered 2014-02-14: 80 mg via INTRAMUSCULAR

## 2014-02-14 NOTE — Assessment & Plan Note (Signed)
Labs CXR 

## 2014-02-14 NOTE — Assessment & Plan Note (Signed)
Depomedrol 80 mg IM S/p derm appt/Rx

## 2014-02-14 NOTE — Progress Notes (Signed)
Pre visit review using our clinic review tool, if applicable. No additional management support is needed unless otherwise documented below in the visit note. 

## 2014-02-14 NOTE — Assessment & Plan Note (Signed)
We discussed age appropriate health related issues, including available/recomended screening tests and vaccinations. We discussed a need for adhering to healthy diet and exercise. Labs/EKG were reviewed/ordered. All questions were answered.  Labs 

## 2014-02-14 NOTE — Progress Notes (Signed)
  Subjective:     HPI  The patient is here for a wellness exam. The patient has been doing well overall without major physical or psychological issues going on lately, except for rash x months: 2 types  C/o fatigue and nose bleeds x months. He saw ENT before  Wt Readings from Last 3 Encounters:  02/14/14 161 lb (73.029 kg)  07/09/13 154 lb 12.8 oz (70.217 kg)  11/06/12 159 lb (72.122 kg)   BP Readings from Last 3 Encounters:  02/14/14 120/68  07/09/13 102/82  11/06/12 128/76       Review of Systems  Constitutional: Negative for appetite change, fatigue and unexpected weight change.  HENT: Negative for congestion, nosebleeds, sneezing, sore throat and trouble swallowing.   Eyes: Negative for itching and visual disturbance.  Respiratory: Negative for cough.   Cardiovascular: Negative for chest pain, palpitations and leg swelling.  Gastrointestinal: Negative for nausea, diarrhea, blood in stool and abdominal distention.  Genitourinary: Negative for frequency and hematuria.  Musculoskeletal: Negative for back pain, gait problem, joint swelling and neck pain.  Skin: Positive for rash.  Neurological: Negative for dizziness, tremors, speech difficulty and weakness.  Psychiatric/Behavioral: Negative for sleep disturbance, dysphoric mood and agitation. The patient is not nervous/anxious.        Objective:   Physical Exam  Constitutional: He is oriented to person, place, and time. He appears well-developed.  HENT:  Mouth/Throat: Oropharynx is clear and moist.  Eyes: Conjunctivae are normal. Pupils are equal, round, and reactive to light.  Neck: Normal range of motion. No JVD present. No thyromegaly present.  Cardiovascular: Normal rate, regular rhythm, normal heart sounds and intact distal pulses.  Exam reveals no gallop and no friction rub.   No murmur heard. Pulmonary/Chest: Effort normal and breath sounds normal. No respiratory distress. He has no wheezes. He has no rales. He  exhibits no tenderness.  Abdominal: Soft. Bowel sounds are normal. He exhibits no distension and no mass. There is no tenderness. There is no rebound and no guarding.  Musculoskeletal: Normal range of motion. He exhibits no edema and no tenderness.  Lymphadenopathy:    He has no cervical adenopathy.  Neurological: He is alert and oriented to person, place, and time. He has normal reflexes. No cranial nerve deficit. He exhibits normal muscle tone. Coordination normal.  Skin: Skin is warm and dry. Rash noted.  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 His behavior is normal. Judgment and thought content normal.   Lab Results  Component Value Date   WBC 5.4 07/09/2013   HGB 14.8 07/09/2013   HCT 42.5 07/09/2013   PLT 188.0 07/09/2013   GLUCOSE 86 07/09/2013   CHOL 190 07/09/2013   TRIG 65.0 07/09/2013   HDL 44.30 07/09/2013   LDLDIRECT 144.2 11/06/2012   LDLCALC 133* 07/09/2013   ALT 22 07/09/2013   AST 21 07/09/2013   NA 140 07/09/2013   K 4.1 07/09/2013   CL 106 07/09/2013   CREATININE 0.9 07/09/2013   BUN 16 07/09/2013   CO2 28 07/09/2013   TSH 0.77 07/09/2013   PSA 1.90 11/10/2009          Assessment & Plan:

## 2014-02-14 NOTE — Assessment & Plan Note (Signed)
Continue with current prescription therapy as reflected on the Med list.  

## 2014-02-15 LAB — BASIC METABOLIC PANEL
BUN: 18 mg/dL (ref 6–23)
CO2: 30 meq/L (ref 19–32)
Calcium: 9.7 mg/dL (ref 8.4–10.5)
Chloride: 105 mEq/L (ref 96–112)
Creatinine, Ser: 1 mg/dL (ref 0.4–1.5)
GFR: 90.09 mL/min (ref >60.00)
Glucose, Bld: 81 mg/dL (ref 70–99)
POTASSIUM: 4.5 meq/L (ref 3.5–5.1)
SODIUM: 139 meq/L (ref 135–145)

## 2014-02-15 LAB — VITAMIN B12: VITAMIN B 12: 206 pg/mL — AB (ref 211–911)

## 2014-02-15 LAB — HEPATIC FUNCTION PANEL
ALT: 23 U/L (ref 0–53)
AST: 27 U/L (ref 0–37)
Albumin: 4.2 g/dL (ref 3.5–5.2)
Alkaline Phosphatase: 70 U/L (ref 39–117)
BILIRUBIN TOTAL: 0.6 mg/dL (ref 0.3–1.2)
Bilirubin, Direct: 0.1 mg/dL (ref 0.0–0.3)
Total Protein: 7.2 g/dL (ref 6.0–8.3)

## 2014-02-15 LAB — SEDIMENTATION RATE: SED RATE: 12 mm/h (ref 0–22)

## 2014-02-15 LAB — PSA: PSA: 1.98 ng/mL (ref 0.10–4.00)

## 2014-02-15 LAB — TSH: TSH: 0.8 u[IU]/mL (ref 0.35–5.50)

## 2014-02-16 ENCOUNTER — Other Ambulatory Visit: Payer: Self-pay | Admitting: Internal Medicine

## 2014-02-16 ENCOUNTER — Encounter: Payer: Self-pay | Admitting: Internal Medicine

## 2014-02-16 DIAGNOSIS — E538 Deficiency of other specified B group vitamins: Secondary | ICD-10-CM | POA: Insufficient documentation

## 2014-02-16 LAB — NMR LIPOPROFILE WITHOUT LIPIDS
HDL PARTICLE NUMBER: 31.3 umol/L (ref >=30.5)
HDL Size: 9.1 nm — ABNORMAL LOW (ref >=9.2)
LARGE HDL: 6.6 umol/L (ref >=4.8)
LARGE VLDL-P: 0.8 nmol/L (ref <=2.7)
LDL PARTICLE NUMBER: 1993 nmol/L — AB (ref <1000)
LDL Size: 20.5 nm — ABNORMAL LOW (ref >20.5)
LP-IR Score: 41 (ref <=45)
Small LDL Particle Number: 1223 nmol/L — ABNORMAL HIGH (ref <=527)
VLDL Size: 52.1 nm — ABNORMAL HIGH (ref <=46.6)

## 2014-02-16 MED ORDER — VITAMIN B-12 1000 MCG SL SUBL: 1.0000 | SUBLINGUAL_TABLET | Freq: Every day | SUBLINGUAL | Status: DC

## 2014-02-23 ENCOUNTER — Ambulatory Visit (INDEPENDENT_AMBULATORY_CARE_PROVIDER_SITE_OTHER): Payer: PRIVATE HEALTH INSURANCE | Admitting: Internal Medicine

## 2014-02-23 ENCOUNTER — Encounter: Payer: Self-pay | Admitting: Internal Medicine

## 2014-02-23 VITALS — BP 112/80 | HR 60 | Temp 98.4°F | Resp 16 | Wt 159.0 lb

## 2014-02-23 DIAGNOSIS — J209 Acute bronchitis, unspecified: Secondary | ICD-10-CM

## 2014-02-23 DIAGNOSIS — E538 Deficiency of other specified B group vitamins: Secondary | ICD-10-CM

## 2014-02-23 MED ORDER — CYANOCOBALAMIN 1000 MCG/ML IJ SOLN
1000.0000 ug | Freq: Once | INTRAMUSCULAR | Status: AC
  Administered 2014-02-23: 1000 ug via INTRAMUSCULAR

## 2014-02-23 MED ORDER — PROMETHAZINE-CODEINE 6.25-10 MG/5ML PO SYRP: 5.0000 mL | ORAL_SOLUTION | ORAL | Status: DC | PRN

## 2014-02-23 NOTE — Assessment & Plan Note (Signed)
To work 04/30/14 if ok Finish Augmentin Prom-Cod syr

## 2014-02-23 NOTE — Patient Instructions (Signed)

## 2014-02-23 NOTE — Progress Notes (Signed)
P  Subjective:     URI  The current episode started in the past 7 days. The problem has been gradually worsening. There has been no fever. Associated symptoms include chest pain, coughing and a rash. Pertinent negatives include no congestion, diarrhea, nausea, neck pain, sinus pain, sneezing or sore throat. Treatments tried: on augmentin. The treatment provided no relief.    F/u rash x months: better  F/u nose bleeds x months - resolved. He saw ENT before  Wt Readings from Last 3 Encounters:  02/23/14 159 lb (72.122 kg)  02/14/14 161 lb (73.029 kg)  07/09/13 154 lb 12.8 oz (70.217 kg)   BP Readings from Last 3 Encounters:  02/23/14 112/80  02/14/14 120/68  07/09/13 102/82       Review of Systems  Constitutional: Negative for appetite change, fatigue and unexpected weight change.  HENT: Negative for congestion, nosebleeds, sneezing, sore throat and trouble swallowing.   Eyes: Negative for itching and visual disturbance.  Respiratory: Positive for cough.   Cardiovascular: Positive for chest pain. Negative for palpitations and leg swelling.  Gastrointestinal: Negative for nausea, diarrhea, blood in stool and abdominal distention.  Genitourinary: Negative for frequency and hematuria.  Musculoskeletal: Negative for back pain, gait problem, joint swelling and neck pain.  Skin: Positive for rash.  Neurological: Negative for dizziness, tremors, speech difficulty and weakness.  Psychiatric/Behavioral: Negative for sleep disturbance, dysphoric mood and agitation. The patient is not nervous/anxious.        Objective:   Physical Exam  Constitutional: He is oriented to person, place, and time. He appears well-developed.  HENT:  Mouth/Throat: Oropharynx is clear and moist.  Eyes: Conjunctivae are normal. Pupils are equal, round, and reactive to light.  Neck: Normal range of motion. No JVD present. No thyromegaly present.  Cardiovascular: Normal rate, regular rhythm, normal heart  sounds and intact distal pulses.  Exam reveals no gallop and no friction rub.   No murmur heard. Pulmonary/Chest: Effort normal and breath sounds normal. No respiratory distress. He has no wheezes. He has no rales. He exhibits no tenderness.  Abdominal: Soft. Bowel sounds are normal. He exhibits no distension and no mass. There is no tenderness. There is no rebound and no guarding.  Musculoskeletal: Normal range of motion. He exhibits no edema and no tenderness.  Lymphadenopathy:    He has no cervical adenopathy.  Neurological: He is alert and oriented to person, place, and time. He has normal reflexes. No cranial nerve deficit. He exhibits normal muscle tone. Coordination normal.  Skin: Skin is warm and dry. Rash noted.   Scaly hypopigm papules - large on the back - better  Psychiatric: He has a normal mood and affect. His behavior is normal. Judgment and thought content normal.   Lab Results  Component Value Date   WBC 6.8 02/14/2014   HGB 15.1 02/14/2014   HCT 45.8 02/14/2014   PLT 220.0 02/14/2014   GLUCOSE 81 02/14/2014   CHOL 190 07/09/2013   TRIG 65.0 07/09/2013   HDL 44.30 07/09/2013   LDLDIRECT 144.2 11/06/2012   LDLCALC 133* 07/09/2013   ALT 23 02/14/2014   AST 27 02/14/2014   NA 139 02/14/2014   K 4.5 02/14/2014   CL 105 02/14/2014   CREATININE 1.0 02/14/2014   BUN 18 02/14/2014   CO2 30 02/14/2014   TSH 0.80 02/14/2014   PSA 1.98 02/14/2014          Assessment & Plan:

## 2014-02-23 NOTE — Progress Notes (Signed)
Pre visit review using our clinic review tool, if applicable. No additional management support is needed unless otherwise documented below in the visit note. 

## 2014-02-24 NOTE — Assessment & Plan Note (Signed)
Discussed  SL B12 IM inj given today

## 2015-02-14 ENCOUNTER — Ambulatory Visit (INDEPENDENT_AMBULATORY_CARE_PROVIDER_SITE_OTHER): Payer: BLUE CROSS/BLUE SHIELD | Admitting: Internal Medicine

## 2015-02-14 ENCOUNTER — Encounter: Payer: Self-pay | Admitting: Internal Medicine

## 2015-02-14 ENCOUNTER — Other Ambulatory Visit (INDEPENDENT_AMBULATORY_CARE_PROVIDER_SITE_OTHER): Payer: BLUE CROSS/BLUE SHIELD

## 2015-02-14 VITALS — BP 140/90 | HR 57 | Temp 97.7°F | Ht 66.0 in | Wt 165.5 lb

## 2015-02-14 DIAGNOSIS — Z0000 Encounter for general adult medical examination without abnormal findings: Secondary | ICD-10-CM

## 2015-02-14 DIAGNOSIS — N320 Bladder-neck obstruction: Secondary | ICD-10-CM | POA: Insufficient documentation

## 2015-02-14 DIAGNOSIS — E538 Deficiency of other specified B group vitamins: Secondary | ICD-10-CM

## 2015-02-14 LAB — CBC WITH DIFFERENTIAL/PLATELET
Basophils Absolute: 0 10*3/uL (ref 0.0–0.1)
Basophils Relative: 0.7 % (ref 0.0–3.0)
EOS ABS: 0.2 10*3/uL (ref 0.0–0.7)
EOS PCT: 4.3 % (ref 0.0–5.0)
HEMATOCRIT: 44.6 % (ref 39.0–52.0)
Hemoglobin: 15.6 g/dL (ref 13.0–17.0)
LYMPHS ABS: 2 10*3/uL (ref 0.7–4.0)
Lymphocytes Relative: 36 % (ref 12.0–46.0)
MCHC: 34.9 g/dL (ref 30.0–36.0)
MCV: 85.6 fl (ref 78.0–100.0)
MONO ABS: 0.3 10*3/uL (ref 0.1–1.0)
Monocytes Relative: 6.1 % (ref 3.0–12.0)
NEUTROS ABS: 2.9 10*3/uL (ref 1.4–7.7)
Neutrophils Relative %: 52.9 % (ref 43.0–77.0)
Platelets: 213 10*3/uL (ref 150.0–400.0)
RBC: 5.21 Mil/uL (ref 4.22–5.81)
RDW: 13.1 % (ref 11.5–15.5)
WBC: 5.5 10*3/uL (ref 4.0–10.5)

## 2015-02-14 LAB — LIPID PANEL
CHOLESTEROL: 203 mg/dL — AB (ref 0–200)
HDL: 45.2 mg/dL (ref >39.00)
LDL CALC: 135 mg/dL — AB (ref 0–99)
NONHDL: 157.8
Total CHOL/HDL Ratio: 4
Triglycerides: 115 mg/dL (ref 0.0–149.0)
VLDL: 23 mg/dL (ref 0.0–40.0)

## 2015-02-14 LAB — BASIC METABOLIC PANEL
BUN: 14 mg/dL (ref 6–23)
CALCIUM: 9.4 mg/dL (ref 8.4–10.5)
CO2: 32 mEq/L (ref 19–32)
CREATININE: 0.89 mg/dL (ref 0.40–1.50)
Chloride: 104 mEq/L (ref 96–112)
GFR: 96.73 mL/min (ref >60.00)
Glucose, Bld: 87 mg/dL (ref 70–99)
Potassium: 4.4 mEq/L (ref 3.5–5.1)
Sodium: 139 mEq/L (ref 135–145)

## 2015-02-14 LAB — HEPATIC FUNCTION PANEL
ALK PHOS: 84 U/L (ref 39–117)
ALT: 27 U/L (ref 0–53)
AST: 26 U/L (ref 0–37)
Albumin: 4.3 g/dL (ref 3.5–5.2)
BILIRUBIN DIRECT: 0.2 mg/dL (ref 0.0–0.3)
TOTAL PROTEIN: 7.2 g/dL (ref 6.0–8.3)
Total Bilirubin: 0.8 mg/dL (ref 0.2–1.2)

## 2015-02-14 LAB — URINALYSIS
Bilirubin Urine: NEGATIVE
HGB URINE DIPSTICK: NEGATIVE
Ketones, ur: NEGATIVE
Leukocytes, UA: NEGATIVE
Nitrite: NEGATIVE
Specific Gravity, Urine: 1.02 (ref 1.000–1.030)
Urine Glucose: NEGATIVE
Urobilinogen, UA: 0.2 (ref 0.0–1.0)
pH: 7 (ref 5.0–8.0)

## 2015-02-14 LAB — VITAMIN B12

## 2015-02-14 LAB — PSA: PSA: 2.97 ng/mL (ref 0.10–4.00)

## 2015-02-14 LAB — TSH: TSH: 0.96 u[IU]/mL (ref 0.35–4.50)

## 2015-02-14 NOTE — Assessment & Plan Note (Signed)
Labs

## 2015-02-14 NOTE — Patient Instructions (Signed)
Saw Palmetto for prostate 

## 2015-02-14 NOTE — Progress Notes (Signed)
  Subjective:     HPI  The patient is here for a wellness exam. The patient has been doing well overall without major physical or psychological issues going on lately, except for rash x months: 2 types  C/o weak urine stream and weak stream and dribbling x months. Nocturia x3  Wt Readings from Last 3 Encounters:  02/14/15 165 lb 8 oz (75.07 kg)  02/23/14 159 lb (72.122 kg)  02/14/14 161 lb (73.029 kg)   BP Readings from Last 3 Encounters:  02/14/15 140/90  02/23/14 112/80  02/14/14 120/68       Review of Systems  Constitutional: Negative for appetite change, fatigue and unexpected weight change.  HENT: Negative for congestion, nosebleeds, sneezing, sore throat and trouble swallowing.   Eyes: Negative for itching and visual disturbance.  Respiratory: Negative for cough.   Cardiovascular: Negative for chest pain, palpitations and leg swelling.  Gastrointestinal: Negative for nausea, diarrhea, blood in stool and abdominal distention.  Genitourinary: Negative for frequency and hematuria.  Musculoskeletal: Negative for back pain, joint swelling, gait problem and neck pain.  Skin: Positive for rash.  Neurological: Negative for dizziness, tremors, speech difficulty and weakness.  Psychiatric/Behavioral: Negative for sleep disturbance, dysphoric mood and agitation. The patient is not nervous/anxious.        Objective:   Physical Exam  Constitutional: He is oriented to person, place, and time. He appears well-developed.  HENT:  Mouth/Throat: Oropharynx is clear and moist.  Eyes: Conjunctivae are normal. Pupils are equal, round, and reactive to light.  Neck: Normal range of motion. No JVD present. No thyromegaly present.  Cardiovascular: Normal rate, regular rhythm, normal heart sounds and intact distal pulses.  Exam reveals no gallop and no friction rub.   No murmur heard. Pulmonary/Chest: Effort normal and breath sounds normal. No respiratory distress. He has no wheezes. He has  no rales. He exhibits no tenderness.  Abdominal: Soft. Bowel sounds are normal. He exhibits no distension and no mass. There is no tenderness. There is no rebound and no guarding.  Genitourinary: Rectum normal. Guaiac negative stool.  Prostate 1+  Musculoskeletal: Normal range of motion. He exhibits no edema or tenderness.  Lymphadenopathy:    He has no cervical adenopathy.  Neurological: He is alert and oriented to person, place, and time. He has normal reflexes. No cranial nerve deficit. He exhibits normal muscle tone. Coordination normal.  Skin: Skin is warm and dry. No rash noted.  Psychiatric: He has a normal mood and affect. His behavior is normal. Judgment and thought content normal.   Lab Results  Component Value Date   WBC 6.8 02/14/2014   HGB 15.1 02/14/2014   HCT 45.8 02/14/2014   PLT 220.0 02/14/2014   GLUCOSE 81 02/14/2014   CHOL 190 07/09/2013   TRIG 65.0 07/09/2013   HDL 44.30 07/09/2013   LDLDIRECT 144.2 11/06/2012   LDLCALC 133* 07/09/2013   ALT 23 02/14/2014   AST 27 02/14/2014   NA 139 02/14/2014   K 4.5 02/14/2014   CL 105 02/14/2014   CREATININE 1.0 02/14/2014   BUN 18 02/14/2014   CO2 30 02/14/2014   TSH 0.80 02/14/2014   PSA 1.98 02/14/2014          Assessment & Plan:

## 2015-02-14 NOTE — Assessment & Plan Note (Signed)
Med options discussed PSA He would like to try saw palmetto

## 2015-02-14 NOTE — Assessment & Plan Note (Signed)
We discussed age appropriate health related issues, including available/recomended screening tests and vaccinations. We discussed a need for adhering to healthy diet and exercise. Labs/EKG were reviewed/ordered. All questions were answered.   

## 2015-02-14 NOTE — Progress Notes (Signed)
Pre visit review using our clinic review tool, if applicable. No additional management support is needed unless otherwise documented below in the visit note. 

## 2015-02-17 ENCOUNTER — Encounter: Payer: PRIVATE HEALTH INSURANCE | Admitting: Internal Medicine

## 2015-02-20 ENCOUNTER — Telehealth: Payer: Self-pay | Admitting: Internal Medicine

## 2015-02-20 NOTE — Telephone Encounter (Signed)
Letter printed for MD to sign.

## 2015-02-20 NOTE — Telephone Encounter (Signed)
Pt request note that he was here for a physical on 02/14/15 and copy of his lab work to be mail to him. Please call pt

## 2015-02-21 NOTE — Telephone Encounter (Signed)
Letter and copy of labs mailed to pt.

## 2015-03-04 IMAGING — CR DG CHEST 2V
2 series · 2 of 2 positions shown · non-contrast
Comparison: 08/26/2007.

CLINICAL DATA: Fatigue and cough.

EXAM:
CHEST  2 VIEW

[view not recorded (1 of 2)]
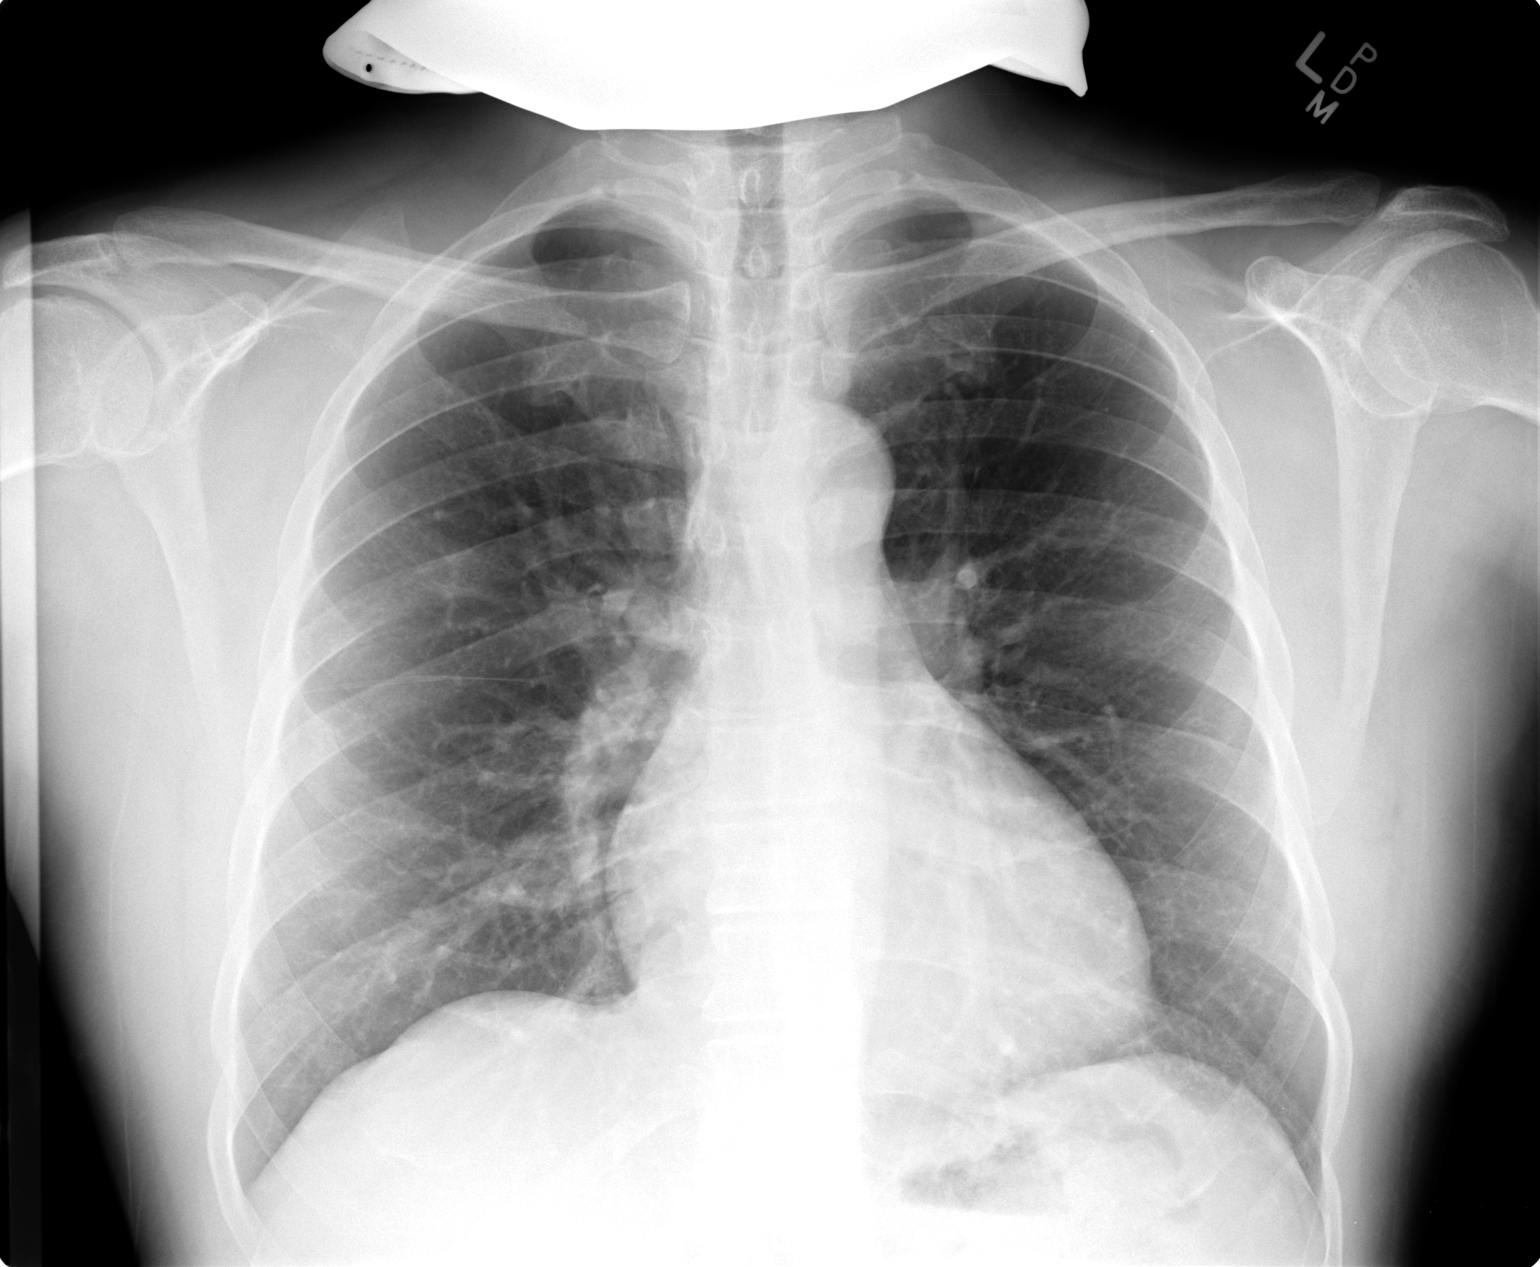

[view not recorded (2 of 2)]
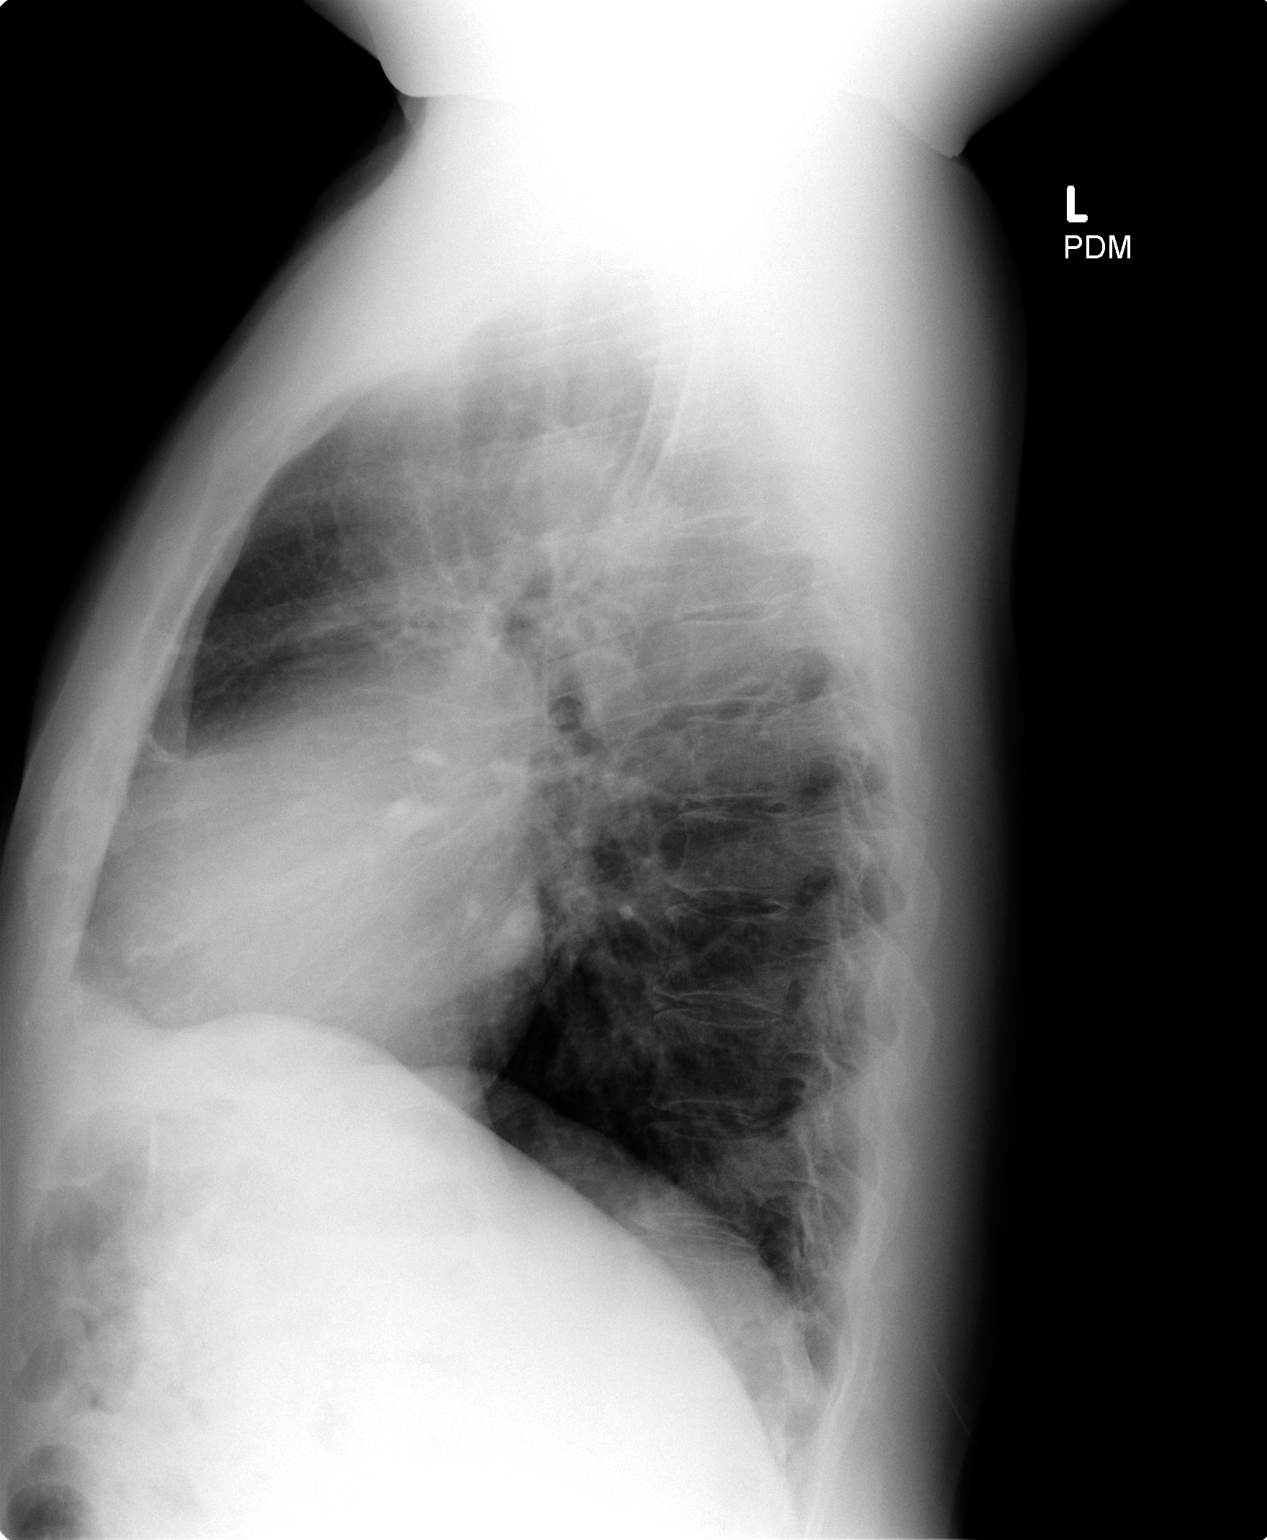

[2 of 2 positions shown; findings below may reference images not displayed]

FINDINGS: The heart size and mediastinal contours are within normal limits.
Both lungs are clear. The visualized skeletal structures are
unremarkable.
IMPRESSION: No active cardiopulmonary disease.

## 2016-03-29 ENCOUNTER — Other Ambulatory Visit (INDEPENDENT_AMBULATORY_CARE_PROVIDER_SITE_OTHER): Payer: BLUE CROSS/BLUE SHIELD

## 2016-03-29 ENCOUNTER — Encounter: Payer: Self-pay | Admitting: Internal Medicine

## 2016-03-29 ENCOUNTER — Ambulatory Visit (INDEPENDENT_AMBULATORY_CARE_PROVIDER_SITE_OTHER): Payer: BLUE CROSS/BLUE SHIELD | Admitting: Internal Medicine

## 2016-03-29 VITALS — BP 128/86 | HR 61 | Temp 98.4°F | Resp 12 | Ht 66.0 in | Wt 168.8 lb

## 2016-03-29 DIAGNOSIS — J209 Acute bronchitis, unspecified: Secondary | ICD-10-CM | POA: Diagnosis not present

## 2016-03-29 DIAGNOSIS — Z0000 Encounter for general adult medical examination without abnormal findings: Secondary | ICD-10-CM | POA: Diagnosis not present

## 2016-03-29 DIAGNOSIS — E538 Deficiency of other specified B group vitamins: Secondary | ICD-10-CM

## 2016-03-29 DIAGNOSIS — Z0001 Encounter for general adult medical examination with abnormal findings: Secondary | ICD-10-CM

## 2016-03-29 DIAGNOSIS — E559 Vitamin D deficiency, unspecified: Secondary | ICD-10-CM

## 2016-03-29 DIAGNOSIS — N320 Bladder-neck obstruction: Secondary | ICD-10-CM | POA: Diagnosis not present

## 2016-03-29 LAB — LIPID PANEL
Cholesterol: 204 mg/dL — ABNORMAL HIGH (ref 0–200)
HDL: 40.9 mg/dL (ref >39.00)
LDL Cholesterol: 133 mg/dL — ABNORMAL HIGH (ref 0–99)
NonHDL: 162.77
TRIGLYCERIDES: 149 mg/dL (ref 0.0–149.0)
Total CHOL/HDL Ratio: 5
VLDL: 29.8 mg/dL (ref 0.0–40.0)

## 2016-03-29 LAB — CBC WITH DIFFERENTIAL/PLATELET
BASOS PCT: 0.4 % (ref 0.0–3.0)
Basophils Absolute: 0 10*3/uL (ref 0.0–0.1)
EOS PCT: 3.7 % (ref 0.0–5.0)
Eosinophils Absolute: 0.2 10*3/uL (ref 0.0–0.7)
HCT: 44.6 % (ref 39.0–52.0)
Hemoglobin: 15.4 g/dL (ref 13.0–17.0)
LYMPHS ABS: 2 10*3/uL (ref 0.7–4.0)
Lymphocytes Relative: 30.9 % (ref 12.0–46.0)
MCHC: 34.4 g/dL (ref 30.0–36.0)
MCV: 85.8 fl (ref 78.0–100.0)
MONO ABS: 0.4 10*3/uL (ref 0.1–1.0)
Monocytes Relative: 6.8 % (ref 3.0–12.0)
NEUTROS ABS: 3.7 10*3/uL (ref 1.4–7.7)
NEUTROS PCT: 58.2 % (ref 43.0–77.0)
PLATELETS: 209 10*3/uL (ref 150.0–400.0)
RBC: 5.2 Mil/uL (ref 4.22–5.81)
RDW: 13.4 % (ref 11.5–15.5)
WBC: 6.4 10*3/uL (ref 4.0–10.5)

## 2016-03-29 LAB — VITAMIN D 25 HYDROXY (VIT D DEFICIENCY, FRACTURES): VITD: 16.97 ng/mL — AB (ref 30.00–100.00)

## 2016-03-29 LAB — URINALYSIS
BILIRUBIN URINE: NEGATIVE
Ketones, ur: NEGATIVE
Leukocytes, UA: NEGATIVE
NITRITE: NEGATIVE
Specific Gravity, Urine: 1.02 (ref 1.000–1.030)
Total Protein, Urine: NEGATIVE
URINE GLUCOSE: NEGATIVE
UROBILINOGEN UA: 0.2 (ref 0.0–1.0)
pH: 6.5 (ref 5.0–8.0)

## 2016-03-29 LAB — PSA: PSA: 2.75 ng/mL (ref 0.10–4.00)

## 2016-03-29 LAB — POCT CBG (FASTING - GLUCOSE)-MANUAL ENTRY: GLUCOSE FASTING, POC: 71 mg/dL (ref 70–99)

## 2016-03-29 LAB — VITAMIN B12: VITAMIN B 12: 307 pg/mL (ref 211–911)

## 2016-03-29 LAB — BASIC METABOLIC PANEL
BUN: 15 mg/dL (ref 6–23)
CALCIUM: 9.5 mg/dL (ref 8.4–10.5)
CO2: 29 mEq/L (ref 19–32)
CREATININE: 0.82 mg/dL (ref 0.40–1.50)
Chloride: 104 mEq/L (ref 96–112)
GFR: 105.83 mL/min (ref >60.00)
Glucose, Bld: 86 mg/dL (ref 70–99)
Potassium: 4 mEq/L (ref 3.5–5.1)
SODIUM: 139 meq/L (ref 135–145)

## 2016-03-29 LAB — TSH: TSH: 0.86 u[IU]/mL (ref 0.35–4.50)

## 2016-03-29 MED ORDER — LEVOFLOXACIN 500 MG PO TABS: 500.0000 mg | ORAL_TABLET | Freq: Every day | ORAL | Status: DC

## 2016-03-29 MED ORDER — ALFUZOSIN HCL ER 10 MG PO TB24: 10.0000 mg | ORAL_TABLET | Freq: Every day | ORAL | Status: DC

## 2016-03-29 NOTE — Assessment & Plan Note (Signed)
Labs Levaquin for ?prostatitis Uraxatral po

## 2016-03-29 NOTE — Assessment & Plan Note (Signed)
We discussed age appropriate health related issues, including available/recomended screening tests and vaccinations. We discussed a need for adhering to healthy diet and exercise. Labs/EKG were reviewed/ordered. All questions were answered.   

## 2016-03-29 NOTE — Progress Notes (Signed)
Pre visit review using our clinic review tool, if applicable. No additional management support is needed unless otherwise documented below in the visit note. 

## 2016-03-29 NOTE — Patient Instructions (Signed)
Use over-the-counter  "cold" medicines  such as"Afrin" nasal spray for nasal congestion as directed instead. Use " Delsym" or" Robitussin" cough syrup varietis for cough.  You can use plain "Tylenol" or "Advil" for fever, chills and achyness. Use Halls or Ricola cough drops.  Please, make an appointment if you are not better or if you're worse.  

## 2016-03-29 NOTE — Assessment & Plan Note (Signed)
Levaquin Rx 

## 2016-03-29 NOTE — Assessment & Plan Note (Signed)
Risks associated with treatment noncompliance were discussed. Compliance was encouraged. Re-start Rx Labs 

## 2016-03-29 NOTE — Assessment & Plan Note (Signed)
On B12 - not taking - restart Risks associated with treatment noncompliance were discussed. Compliance was encouraged.

## 2016-03-29 NOTE — Progress Notes (Addendum)
Subjective:  Patient ID: Andrew Salazar, male    DOB: [birth year]  Age: 50 y.o. MRN: [id number]  CC: No chief complaint on file.   HPI   Andrew Salazar presents for a well exam.   C/ocough after he was exposed to fumes in the kitchen x 3 d C/o pain in B shoulders - worse at night C/o frequent urination x3-4 mo at night F/u Vit D and B12 def - not taking meds for ?reason. C/o fatigue    Outpatient Prescriptions Prior to Visit  Medication Sig Dispense Refill  . Cholecalciferol 1000 UNITS tablet Take 1,000 Units by mouth daily. Reported on 03/29/2016    . Cyanocobalamin (VITAMIN B-12) 1000 MCG SUBL Place 1 tablet (1,000 mcg total) under the tongue daily. (Patient not taking: Reported on 03/29/2016) 100 tablet 3  . Omega-3 Fatty Acids (FISH OIL PO) Take 1 each by mouth daily. Reported on 03/29/2016     No facility-administered medications prior to visit.    ROS Review of Systems  Constitutional: Negative for appetite change, fatigue and unexpected weight change.  HENT: Negative for congestion, nosebleeds, sneezing, sore throat and trouble swallowing.   Eyes: Negative for itching and visual disturbance.  Respiratory: Negative for cough.   Cardiovascular: Negative for chest pain, palpitations and leg swelling.  Gastrointestinal: Negative for nausea, diarrhea, blood in stool and abdominal distention.  Genitourinary: Positive for dysuria and urgency. Negative for frequency and hematuria.  Musculoskeletal: Positive for arthralgias. Negative for back pain, joint swelling, gait problem and neck pain.  Skin: Negative for rash.  Neurological: Negative for dizziness, tremors, speech difficulty and weakness.  Psychiatric/Behavioral: Negative for suicidal ideas, sleep disturbance, dysphoric mood and agitation. The patient is not nervous/anxious.     Objective:  BP 128/86 mmHg  Pulse 61  Temp(Src) 98.4 F (36.9 C) (Oral)  Resp 12  Ht 5\' 6"  (1.676 m)  Wt 168 lb 12.8 oz (76.567 kg)  BMI  27.26 kg/m2  SpO2 97%  BP Readings from Last 3 Encounters:  03/29/16 128/86  02/14/15 140/90  02/23/14 112/80    Wt Readings from Last 3 Encounters:  03/29/16 168 lb 12.8 oz (76.567 kg)  02/14/15 165 lb 8 oz (75.07 kg)  02/23/14 159 lb (72.122 kg)    Physical Exam  Constitutional: He is oriented to person, place, and time. He appears well-developed and well-nourished. No distress.  HENT:  Head: Normocephalic and atraumatic.  Right Ear: External ear normal.  Left Ear: External ear normal.  Nose: Nose normal.  Mouth/Throat: Oropharynx is clear and moist. No oropharyngeal exudate.  Eyes: Conjunctivae and EOM are normal. Pupils are equal, round, and reactive to light. Right eye exhibits no discharge. Left eye exhibits no discharge. No scleral icterus.  Neck: Normal range of motion. Neck supple. No JVD present. No tracheal deviation present. No thyromegaly present.  Cardiovascular: Normal rate, regular rhythm, normal heart sounds and intact distal pulses.  Exam reveals no gallop and no friction rub.   No murmur heard. Pulmonary/Chest: Effort normal and breath sounds normal. No stridor. No respiratory distress. He has no wheezes. He has no rales. He exhibits no tenderness.  Abdominal: Soft. Bowel sounds are normal. He exhibits no distension and no mass. There is no tenderness. There is no rebound and no guarding.  Genitourinary: Rectum normal and penis normal. Guaiac negative stool. No penile tenderness.  Musculoskeletal: Normal range of motion. He exhibits no edema or tenderness.  Lymphadenopathy:    He has no cervical adenopathy.  Neurological: He  is alert and oriented to person, place, and time. He has normal reflexes. No cranial nerve deficit. He exhibits normal muscle tone. Coordination normal.  Skin: Skin is warm and dry. No rash noted. He is not diaphoretic. No erythema. No pallor.  Psychiatric: He has a normal mood and affect. His behavior is normal. Judgment and thought content  normal.  prostate 1+, NT eryth throat  EKG NSR  Lab Results  Component Value Date   WBC 5.5 02/14/2015   HGB 15.6 02/14/2015   HCT 44.6 02/14/2015   PLT 213.0 02/14/2015   GLUCOSE 87 02/14/2015   CHOL 203* 02/14/2015   TRIG 115.0 02/14/2015   HDL 45.20 02/14/2015   LDLDIRECT 144.2 11/06/2012   LDLCALC 135* 02/14/2015   ALT 27 02/14/2015   AST 26 02/14/2015   NA 139 02/14/2015   K 4.4 02/14/2015   CL 104 02/14/2015   CREATININE 0.89 02/14/2015   BUN 14 02/14/2015   CO2 32 02/14/2015   TSH 0.96 02/14/2015   PSA 2.97 02/14/2015    Dg Chest 2 View  02/14/2014  CLINICAL DATA:  Fatigue and cough. EXAM: CHEST  2 VIEW COMPARISON:  08/26/2007. FINDINGS: The heart size and mediastinal contours are within normal limits. Both lungs are clear. The visualized skeletal structures are unremarkable. IMPRESSION: No active cardiopulmonary disease. Electronically Signed   By: Loralie Champagne M.D.   On: 02/14/2014 17:14    Assessment & Plan:   Diagnoses and all orders for this visit:  Well adult exam -     CBC with Differential/Platelet; Future -     Basic metabolic panel; Future -     Hepatitis C antibody; Future -     Lipid panel; Future -     TSH; Future -     PSA; Future -     Urinalysis; Future -     Vitamin B12; Future -     VITAMIN D 25 Hydroxy (Vit-D Deficiency, Fractures); Future  Routine general medical examination at a health care facility -     EKG 12-Lead -     POCT CBG (Fasting - Glucose)  B12 deficiency -     Vitamin B12; Future  Acute bronchitis, unspecified organism  Bladder neck obstruction  Vitamin D deficiency -     VITAMIN D 25 Hydroxy (Vit-D Deficiency, Fractures); Future  Other orders -     levofloxacin (LEVAQUIN) 500 MG tablet; Take 1 tablet (500 mg total) by mouth daily. -     alfuzosin (UROXATRAL) 10 MG 24 hr tablet; Take 1 tablet (10 mg total) by mouth daily with breakfast.  I am having Mr. Wyre start on levofloxacin and alfuzosin. I am also  having him maintain his Cholecalciferol, Omega-3 Fatty Acids (FISH OIL PO), and Vitamin B-12.  Meds ordered this encounter  Medications  . levofloxacin (LEVAQUIN) 500 MG tablet    Sig: Take 1 tablet (500 mg total) by mouth daily.    Dispense:  10 tablet    Refill:  0  . alfuzosin (UROXATRAL) 10 MG 24 hr tablet    Sig: Take 1 tablet (10 mg total) by mouth daily with breakfast.    Dispense:  30 tablet    Refill:  11     Follow-up: No Follow-up on file.  Sonda Primes, MD

## 2016-03-30 LAB — HEPATITIS C ANTIBODY: HCV Ab: NEGATIVE

## 2016-03-30 MED ORDER — ERGOCALCIFEROL 1.25 MG (50000 UT) PO CAPS: 50000.0000 [IU] | ORAL_CAPSULE | ORAL | Status: DC

## 2016-04-12 ENCOUNTER — Telehealth: Payer: Self-pay | Admitting: Internal Medicine

## 2016-04-12 NOTE — Telephone Encounter (Signed)
Pt informed of below. Copies mailed to pt.   Entered by Tresa GarterAleksei Plotnikov V, MD at 03/30/2016 8:44 PM    Dear Mr Posthumus,  Your labs/tests are good. Your Vit B12 is OK (on the low side of normal).  You have a low Vitamin D levels - please, start Vit D prescription 50000 iu weekly (Rx emailed to your pharmacy) followed by over-the-counter Vit D 1000 iu daily.   Sincerely,  Sonda PrimesAlex Plotnikov, MD

## 2016-04-12 NOTE — Telephone Encounter (Signed)
Pt request lab result that was done 03/29/16. Please call him back

## 2017-01-06 ENCOUNTER — Encounter: Payer: Self-pay | Admitting: Internal Medicine

## 2017-01-06 ENCOUNTER — Ambulatory Visit (INDEPENDENT_AMBULATORY_CARE_PROVIDER_SITE_OTHER): Payer: BLUE CROSS/BLUE SHIELD | Admitting: Internal Medicine

## 2017-01-06 ENCOUNTER — Other Ambulatory Visit (INDEPENDENT_AMBULATORY_CARE_PROVIDER_SITE_OTHER): Payer: BLUE CROSS/BLUE SHIELD

## 2017-01-06 VITALS — BP 120/80 | HR 62 | Wt 177.0 lb

## 2017-01-06 DIAGNOSIS — E538 Deficiency of other specified B group vitamins: Secondary | ICD-10-CM | POA: Diagnosis not present

## 2017-01-06 DIAGNOSIS — N320 Bladder-neck obstruction: Secondary | ICD-10-CM

## 2017-01-06 DIAGNOSIS — E559 Vitamin D deficiency, unspecified: Secondary | ICD-10-CM | POA: Diagnosis not present

## 2017-01-06 DIAGNOSIS — Z0000 Encounter for general adult medical examination without abnormal findings: Secondary | ICD-10-CM

## 2017-01-06 LAB — BASIC METABOLIC PANEL
BUN: 16 mg/dL (ref 6–23)
CALCIUM: 9.3 mg/dL (ref 8.4–10.5)
CO2: 29 mEq/L (ref 19–32)
Chloride: 107 mEq/L (ref 96–112)
Creatinine, Ser: 0.92 mg/dL (ref 0.40–1.50)
GFR: 92.38 mL/min (ref >60.00)
GLUCOSE: 98 mg/dL (ref 70–99)
Potassium: 4.2 mEq/L (ref 3.5–5.1)
SODIUM: 142 meq/L (ref 135–145)

## 2017-01-06 LAB — URINALYSIS
BILIRUBIN URINE: NEGATIVE
Ketones, ur: NEGATIVE
LEUKOCYTES UA: NEGATIVE
NITRITE: NEGATIVE
PH: 5.5 (ref 5.0–8.0)
Specific Gravity, Urine: 1.02 (ref 1.000–1.030)
Total Protein, Urine: NEGATIVE
UROBILINOGEN UA: 0.2 (ref 0.0–1.0)
Urine Glucose: NEGATIVE

## 2017-01-06 LAB — CBC WITH DIFFERENTIAL/PLATELET
BASOS ABS: 0 10*3/uL (ref 0.0–0.1)
Basophils Relative: 0.4 % (ref 0.0–3.0)
Eosinophils Absolute: 0.3 10*3/uL (ref 0.0–0.7)
Eosinophils Relative: 4.7 % (ref 0.0–5.0)
HCT: 42.9 % (ref 39.0–52.0)
Hemoglobin: 14.9 g/dL (ref 13.0–17.0)
Lymphocytes Relative: 34.4 % (ref 12.0–46.0)
Lymphs Abs: 2.4 10*3/uL (ref 0.7–4.0)
MCHC: 34.8 g/dL (ref 30.0–36.0)
MCV: 86.5 fl (ref 78.0–100.0)
MONOS PCT: 5.2 % (ref 3.0–12.0)
Monocytes Absolute: 0.4 10*3/uL (ref 0.1–1.0)
Neutro Abs: 3.9 10*3/uL (ref 1.4–7.7)
Neutrophils Relative %: 55.3 % (ref 43.0–77.0)
Platelets: 231 10*3/uL (ref 150.0–400.0)
RBC: 4.96 Mil/uL (ref 4.22–5.81)
RDW: 13.3 % (ref 11.5–15.5)
WBC: 7.1 10*3/uL (ref 4.0–10.5)

## 2017-01-06 LAB — HEPATIC FUNCTION PANEL
ALT: 27 U/L (ref 0–53)
AST: 22 U/L (ref 0–37)
Albumin: 4.1 g/dL (ref 3.5–5.2)
Alkaline Phosphatase: 76 U/L (ref 39–117)
BILIRUBIN DIRECT: 0.1 mg/dL (ref 0.0–0.3)
BILIRUBIN TOTAL: 0.6 mg/dL (ref 0.2–1.2)
Total Protein: 6.8 g/dL (ref 6.0–8.3)

## 2017-01-06 LAB — LIPID PANEL
CHOLESTEROL: 214 mg/dL — AB (ref 0–200)
HDL: 42.5 mg/dL (ref >39.00)
LDL Cholesterol: 135 mg/dL — ABNORMAL HIGH (ref 0–99)
NonHDL: 171.88
Total CHOL/HDL Ratio: 5
Triglycerides: 183 mg/dL — ABNORMAL HIGH (ref 0.0–149.0)
VLDL: 36.6 mg/dL (ref 0.0–40.0)

## 2017-01-06 LAB — PSA: PSA: 3.41 ng/mL (ref 0.10–4.00)

## 2017-01-06 LAB — VITAMIN B12: VITAMIN B 12: 235 pg/mL (ref 211–911)

## 2017-01-06 LAB — TSH: TSH: 1.45 u[IU]/mL (ref 0.35–4.50)

## 2017-01-06 MED ORDER — ALFUZOSIN HCL ER 10 MG PO TB24: 10.0000 mg | ORAL_TABLET | Freq: Every day | ORAL | 11 refills | Status: DC

## 2017-01-06 NOTE — Progress Notes (Signed)
Subjective:  Patient ID: Andrew Salazar, male    DOB: 1966/10/01  Age: 51 y.o. MRN: [id number]  CC: No chief complaint on file.   HPI [name] Turney presents for a well exam w/a new [employer]. C/o night time urinary frequency.F/u B12 and vit d def C/o dizziness at night... Not taking any meds  Outpatient Medications Prior to Visit  Medication Sig Dispense Refill  . alfuzosin (UROXATRAL) 10 MG 24 hr tablet Take 1 tablet (10 mg total) by mouth daily with breakfast. (Patient not taking: Reported on 01/06/2017) 30 tablet 11  . Cholecalciferol 1000 UNITS tablet Take 1,000 Units by mouth daily. Reported on 03/29/2016    . Cyanocobalamin (VITAMIN B-12) 1000 MCG SUBL Place 1 tablet (1,000 mcg total) under the tongue daily. (Patient not taking: Reported on 01/06/2017) 100 tablet 3  . ergocalciferol (VITAMIN D2) 50000 units capsule Take 1 capsule (50,000 Units total) by mouth once a week. (Patient not taking: Reported on 01/06/2017) 6 capsule 0  . levofloxacin (LEVAQUIN) 500 MG tablet Take 1 tablet (500 mg total) by mouth daily. (Patient not taking: Reported on 01/06/2017) 10 tablet 0  . Omega-3 Fatty Acids (FISH OIL PO) Take 1 each by mouth daily. Reported on 03/29/2016     No facility-administered medications prior to visit.     ROS Review of Systems  Constitutional: Negative for appetite change, fatigue and unexpected weight change.  HENT: Negative for congestion, nosebleeds, sneezing, sore throat and trouble swallowing.   Eyes: Negative for itching and visual disturbance.  Respiratory: Negative for cough.   Cardiovascular: Negative for chest pain, palpitations and leg swelling.  Gastrointestinal: Negative for abdominal distention, blood in stool, diarrhea and nausea.  Genitourinary: Positive for frequency. Negative for hematuria.  Musculoskeletal: Negative for back pain, gait problem, joint swelling and neck pain.  Skin: Negative for rash.  Neurological: Positive for light-headedness.  Negative for dizziness, tremors, speech difficulty and weakness.  Psychiatric/Behavioral: Negative for agitation, dysphoric mood and sleep disturbance. The patient is not nervous/anxious.     Objective:  BP 120/80   Pulse 62   Wt 177 lb (80.3 kg)   SpO2 94%   BMI 28.57 kg/m   BP Readings from Last 3 Encounters:  01/06/17 120/80  03/29/16 128/86  02/14/15 140/90    Wt Readings from Last 3 Encounters:  01/06/17 177 lb (80.3 kg)  03/29/16 168 lb 12.8 oz (76.6 kg)  02/14/15 165 lb 8 oz (75.1 kg)    Physical Exam  Constitutional: He is oriented to person, place, and time. He appears well-developed. No distress.  NAD  HENT:  Mouth/Throat: Oropharynx is clear and moist.  Eyes: Conjunctivae are normal. Pupils are equal, round, and reactive to light.  Neck: Normal range of motion. No JVD present. No thyromegaly present.  Cardiovascular: Normal rate, regular rhythm, normal heart sounds and intact distal pulses.  Exam reveals no gallop and no friction rub.   No murmur heard. Pulmonary/Chest: Effort normal and breath sounds normal. No respiratory distress. He has no wheezes. He has no rales. He exhibits no tenderness.  Abdominal: Soft. Bowel sounds are normal. He exhibits no distension and no mass. There is no tenderness. There is no rebound and no guarding.  Musculoskeletal: Normal range of motion. He exhibits no edema or tenderness.  Lymphadenopathy:    He has no cervical adenopathy.  Neurological: He is alert and oriented to person, place, and time. He has normal reflexes. No cranial nerve deficit. He exhibits normal muscle tone. He displays  a negative Romberg sign. Coordination and gait normal.  Skin: Skin is warm and dry. No rash noted.  Psychiatric: He has a normal mood and affect. His behavior is normal. Judgment and thought content normal.    Lab Results  Component Value Date   WBC 6.4 03/29/2016   HGB 15.4 03/29/2016   HCT 44.6 03/29/2016   PLT 209.0 03/29/2016   GLUCOSE  86 03/29/2016   CHOL 204 (H) 03/29/2016   TRIG 149.0 03/29/2016   HDL 40.90 03/29/2016   LDLDIRECT 144.2 11/06/2012   LDLCALC 133 (H) 03/29/2016   ALT 27 02/14/2015   AST 26 02/14/2015   NA 139 03/29/2016   K 4.0 03/29/2016   CL 104 03/29/2016   CREATININE 0.82 03/29/2016   BUN 15 03/29/2016   CO2 29 03/29/2016   TSH 0.86 03/29/2016   PSA 2.75 03/29/2016    Dg Chest 2 View  Result Date: 02/14/2014 CLINICAL DATA:  Fatigue and cough. EXAM: CHEST  2 VIEW COMPARISON:  08/26/2007. FINDINGS: The heart size and mediastinal contours are within normal limits. Both lungs are clear. The visualized skeletal structures are unremarkable. IMPRESSION: No active cardiopulmonary disease. Electronically Signed   By: Loralie Champagne M.D.   On: 02/14/2014 17:14    Assessment & Plan:   There are no diagnoses linked to this encounter. I am having Mr. Brock maintain his Cholecalciferol, Omega-3 Fatty Acids (FISH OIL PO), Vitamin B-12, levofloxacin, alfuzosin, and ergocalciferol.  No orders of the defined types were placed in this encounter.    Follow-up: No Follow-up on file.  Sonda Primes, MD

## 2017-01-06 NOTE — Patient Instructions (Signed)
Colonoscopy, Adult A colonoscopy is an exam to look at the large intestine. It is done to check for problems, such as:  Lumps (tumors).  Growths (polyps).  Swelling (inflammation).  Bleeding. What happens before the procedure? Eating and drinking Follow instructions from your doctor about eating and drinking. These instructions may include:  A few days before the procedure - follow a low-fiber diet.  Avoid nuts.  Avoid seeds.  Avoid dried fruit.  Avoid raw fruits.  Avoid vegetables.  1-3 days before the procedure - follow a clear liquid diet. Avoid liquids that have red or purple dye. Drink only clear liquids, such as:  Clear broth or bouillon.  Black coffee or tea.  Clear juice.  Clear soft drinks or sports drinks.  [place] desert.  Popsicles.  On the day of the procedure - do not eat or drink anything during the 2 hours before the procedure. Bowel prep If you were prescribed an oral bowel prep:  Take it as told by your doctor. Starting the day before your procedure, you will need to drink a lot of liquid. The liquid will cause you to poop (have bowel movements) until your poop is almost clear or light green.  If your skin or butt gets irritated from diarrhea, you may:  Wipe the area with wipes that have medicine in them, such as adult wet wipes with aloe and vitamin E.  Put something on your skin that soothes the area, such as petroleum jelly.  If you throw up (vomit) while drinking the bowel prep, take a break for up to 60 minutes. Then begin the bowel prep again. If you keep throwing up and you cannot take the bowel prep without throwing up, call your doctor. General instructions  Ask your doctor about changing or stopping your normal medicines. This is important if you take diabetes medicines or blood thinners.  Plan to have someone take you home from the hospital or clinic. What happens during the procedure?  An IV tube may be put into one of your  veins.  You will be given medicine to help you relax (sedative).  To reduce your risk of infection:  Your doctors will wash their hands.  Your anal area will be washed with soap.  You will be asked to lie on your side with your knees bent.  Your doctor will get a long, thin, flexible tube ready. The tube will have a camera and a light on the end.  The tube will be put into your anus.  The tube will be gently put into your large intestine.  Air will be delivered into your large intestine to keep it open. You may feel some pressure or cramping.  The camera will be used to take photos.  A small tissue sample may be removed from your body to be looked at under a microscope (biopsy). If any possible problems are found, the tissue will be sent to a lab for testing.  If small growths are found, your doctor may remove them and have them checked for cancer.  The tube that was put into your anus will be slowly removed. The procedure may vary among doctors and hospitals. What happens after the procedure?  Your doctor will check on you often until the medicines you were given have worn off.  Do not drive for 24 hours after the procedure.  You may have a small amount of blood in your poop.  You may pass gas.  You may have mild cramps   or bloating in your belly (abdomen).  It is up to you to get the results of your procedure. Ask your doctor, or the department performing the procedure, when your results will be ready. This information is not intended to replace advice given to you by your health care provider. Make sure you discuss any questions you have with your health care provider. Document Released: 01/11/2011 Document Revised: 08/15/2016 Document Reviewed: 02/20/2016 Elsevier Interactive Patient Education  2017 [employer]  

## 2017-01-06 NOTE — Assessment & Plan Note (Signed)
We discussed age appropriate health related issues, including available/recomended screening tests and vaccinations. We discussed a need for adhering to healthy diet and exercise. Labs/EKG were reviewed/ordered. All questions were answered.  Colon vs cologuard discussed Pt declined shots

## 2017-01-06 NOTE — Assessment & Plan Note (Signed)
  Re-start B12 Risks associated with treatment noncompliance were discussed. Compliance was encouraged. 

## 2017-01-06 NOTE — Assessment & Plan Note (Signed)
Re-start Vit D 

## 2017-01-06 NOTE — Progress Notes (Signed)
Pre visit review using our clinic review tool, if applicable. No additional management support is needed unless otherwise documented below in the visit note. 

## 2017-01-06 NOTE — Assessment & Plan Note (Signed)
Labs Re-start Uroxatral

## 2017-02-05 DIAGNOSIS — Z1211 Encounter for screening for malignant neoplasm of colon: Secondary | ICD-10-CM | POA: Diagnosis not present

## 2017-02-05 DIAGNOSIS — Z1212 Encounter for screening for malignant neoplasm of rectum: Secondary | ICD-10-CM | POA: Diagnosis not present

## 2017-02-05 LAB — COLOGUARD: Cologuard: NEGATIVE

## 2017-02-13 ENCOUNTER — Encounter: Payer: Self-pay | Admitting: Internal Medicine

## 2017-02-13 NOTE — Progress Notes (Signed)
Cologuard abstracted  ?

## 2017-02-18 ENCOUNTER — Telehealth: Payer: Self-pay

## 2017-02-18 NOTE — Telephone Encounter (Signed)
Spoke with patient and notified him his Cologuard results were negative. Results abstracted into his chart

## 2017-04-23 DIAGNOSIS — S63502A Unspecified sprain of left wrist, initial encounter: Secondary | ICD-10-CM | POA: Diagnosis not present

## 2017-04-23 DIAGNOSIS — S6992XA Unspecified injury of left wrist, hand and finger(s), initial encounter: Secondary | ICD-10-CM | POA: Diagnosis not present

## 2017-04-23 DIAGNOSIS — M25532 Pain in left wrist: Secondary | ICD-10-CM | POA: Diagnosis not present

## 2017-04-23 DIAGNOSIS — S60222A Contusion of left hand, initial encounter: Secondary | ICD-10-CM | POA: Diagnosis not present

## 2017-04-23 DIAGNOSIS — Z6829 Body mass index (BMI) 29.0-29.9, adult: Secondary | ICD-10-CM | POA: Diagnosis not present

## 2017-11-26 ENCOUNTER — Other Ambulatory Visit (INDEPENDENT_AMBULATORY_CARE_PROVIDER_SITE_OTHER): Payer: BLUE CROSS/BLUE SHIELD

## 2017-11-26 ENCOUNTER — Ambulatory Visit (INDEPENDENT_AMBULATORY_CARE_PROVIDER_SITE_OTHER): Payer: BLUE CROSS/BLUE SHIELD | Admitting: Internal Medicine

## 2017-11-26 ENCOUNTER — Encounter: Payer: Self-pay | Admitting: Internal Medicine

## 2017-11-26 VITALS — BP 124/82 | HR 52 | Temp 98.4°F | Ht 66.0 in | Wt 179.0 lb

## 2017-11-26 DIAGNOSIS — Z0000 Encounter for general adult medical examination without abnormal findings: Secondary | ICD-10-CM

## 2017-11-26 DIAGNOSIS — Z23 Encounter for immunization: Secondary | ICD-10-CM | POA: Diagnosis not present

## 2017-11-26 DIAGNOSIS — E538 Deficiency of other specified B group vitamins: Secondary | ICD-10-CM | POA: Diagnosis not present

## 2017-11-26 DIAGNOSIS — E559 Vitamin D deficiency, unspecified: Secondary | ICD-10-CM | POA: Diagnosis not present

## 2017-11-26 LAB — URINALYSIS
BILIRUBIN URINE: NEGATIVE
Hgb urine dipstick: NEGATIVE
KETONES UR: NEGATIVE
LEUKOCYTES UA: NEGATIVE
NITRITE: NEGATIVE
PH: 6 (ref 5.0–8.0)
SPECIFIC GRAVITY, URINE: 1.015 (ref 1.000–1.030)
Total Protein, Urine: NEGATIVE
UROBILINOGEN UA: 0.2 (ref 0.0–1.0)
Urine Glucose: NEGATIVE

## 2017-11-26 LAB — BASIC METABOLIC PANEL
BUN: 8 mg/dL (ref 6–23)
CALCIUM: 9.4 mg/dL (ref 8.4–10.5)
CO2: 29 meq/L (ref 19–32)
CREATININE: 0.85 mg/dL (ref 0.40–1.50)
Chloride: 104 mEq/L (ref 96–112)
GFR: 100.85 mL/min (ref >60.00)
GLUCOSE: 91 mg/dL (ref 70–99)
Potassium: 4.2 mEq/L (ref 3.5–5.1)
Sodium: 140 mEq/L (ref 135–145)

## 2017-11-26 LAB — CBC WITH DIFFERENTIAL/PLATELET
BASOS ABS: 0.1 10*3/uL (ref 0.0–0.1)
Basophils Relative: 0.9 % (ref 0.0–3.0)
EOS ABS: 0.3 10*3/uL (ref 0.0–0.7)
Eosinophils Relative: 4.8 % (ref 0.0–5.0)
HEMATOCRIT: 45 % (ref 39.0–52.0)
Hemoglobin: 15.3 g/dL (ref 13.0–17.0)
LYMPHS PCT: 41.6 % (ref 12.0–46.0)
Lymphs Abs: 2.6 10*3/uL (ref 0.7–4.0)
MCHC: 34.1 g/dL (ref 30.0–36.0)
MCV: 88 fl (ref 78.0–100.0)
MONOS PCT: 6 % (ref 3.0–12.0)
Monocytes Absolute: 0.4 10*3/uL (ref 0.1–1.0)
Neutro Abs: 2.9 10*3/uL (ref 1.4–7.7)
Neutrophils Relative %: 46.7 % (ref 43.0–77.0)
Platelets: 225 10*3/uL (ref 150.0–400.0)
RBC: 5.11 Mil/uL (ref 4.22–5.81)
RDW: 13.5 % (ref 11.5–15.5)
WBC: 6.3 10*3/uL (ref 4.0–10.5)

## 2017-11-26 LAB — LIPID PANEL
CHOL/HDL RATIO: 5
Cholesterol: 189 mg/dL (ref 0–200)
HDL: 39.9 mg/dL (ref >39.00)
LDL CALC: 119 mg/dL — AB (ref 0–99)
NONHDL: 149.4
Triglycerides: 150 mg/dL — ABNORMAL HIGH (ref 0.0–149.0)
VLDL: 30 mg/dL (ref 0.0–40.0)

## 2017-11-26 LAB — HEPATIC FUNCTION PANEL
ALBUMIN: 4.6 g/dL (ref 3.5–5.2)
ALK PHOS: 83 U/L (ref 39–117)
ALT: 20 U/L (ref 0–53)
AST: 22 U/L (ref 0–37)
BILIRUBIN DIRECT: 0.1 mg/dL (ref 0.0–0.3)
BILIRUBIN TOTAL: 0.7 mg/dL (ref 0.2–1.2)
Total Protein: 7.6 g/dL (ref 6.0–8.3)

## 2017-11-26 LAB — TSH: TSH: 1.63 u[IU]/mL (ref 0.35–4.50)

## 2017-11-26 LAB — VITAMIN D 25 HYDROXY (VIT D DEFICIENCY, FRACTURES): VITD: 28.48 ng/mL — ABNORMAL LOW (ref 30.00–100.00)

## 2017-11-26 LAB — VITAMIN B12: VITAMIN B 12: 243 pg/mL (ref 211–911)

## 2017-11-26 MED ORDER — ALFUZOSIN HCL ER 10 MG PO TB24: 10.0000 mg | ORAL_TABLET | Freq: Every day | ORAL | 11 refills | Status: DC

## 2017-11-26 NOTE — Assessment & Plan Note (Signed)
We discussed age appropriate health related issues, including available/recomended screening tests and vaccinations. We discussed a need for adhering to healthy diet and exercise. Labs were ordered to be later reviewed . All questions were answered.   

## 2017-11-26 NOTE — Assessment & Plan Note (Signed)
Re-start rx 

## 2017-11-26 NOTE — Addendum Note (Signed)
Addended by: Scarlett PrestoFRIEDENBACH, Jarmal Lewelling on: 11/26/2017 10:50 AM   Modules accepted: Orders

## 2017-11-26 NOTE — Assessment & Plan Note (Signed)
Re-start B12 

## 2017-11-26 NOTE — Progress Notes (Signed)
Subjective:  Patient ID: Andrew Salazar, male    DOB: 30-Sep-1966  Age: 51 y.o. MRN: [id number]  CC: No chief complaint on file.   HPI [name] Espejo presents for a well exam C/o L LBP occasionally  Outpatient Medications Prior to Visit  Medication Sig Dispense Refill  . alfuzosin (UROXATRAL) 10 MG 24 hr tablet Take 1 tablet (10 mg total) by mouth daily with breakfast. 30 tablet 11  . Cholecalciferol 1000 UNITS tablet Take 1,000 Units by mouth daily. Reported on 03/29/2016    . Cyanocobalamin (VITAMIN B-12) 1000 MCG SUBL Place 1 tablet (1,000 mcg total) under the tongue daily. 100 tablet 3  . ergocalciferol (VITAMIN D2) 50000 units capsule Take 1 capsule (50,000 Units total) by mouth once a week. 6 capsule 0  . Omega-3 Fatty Acids (FISH OIL PO) Take 1 each by mouth daily. Reported on 03/29/2016     No facility-administered medications prior to visit.     ROS Review of Systems  Constitutional: Negative for appetite change, fatigue and unexpected weight change.  HENT: Negative for congestion, nosebleeds, sneezing, sore throat and trouble swallowing.   Eyes: Negative for itching and visual disturbance.  Respiratory: Negative for cough.   Cardiovascular: Negative for chest pain, palpitations and leg swelling.  Gastrointestinal: Negative for abdominal distention, blood in stool, diarrhea and nausea.  Genitourinary: Negative for frequency and hematuria.  Musculoskeletal: Positive for back pain. Negative for gait problem, joint swelling and neck pain.  Skin: Negative for rash.  Neurological: Negative for dizziness, tremors, speech difficulty and weakness.  Psychiatric/Behavioral: Negative for agitation, dysphoric mood and sleep disturbance. The patient is not nervous/anxious.     Objective:  BP 124/82 (BP Location: Left Arm, Patient Position: Sitting, Cuff Size: Large)   Pulse (!) 52   Temp 98.4 F (36.9 C) (Oral)   Ht 5\' 6"  (1.676 m)   Wt 179 lb (81.2 kg)   SpO2 98%   BMI 28.89  kg/m   BP Readings from Last 3 Encounters:  11/26/17 124/82  01/06/17 120/80  03/29/16 128/86    Wt Readings from Last 3 Encounters:  11/26/17 179 lb (81.2 kg)  01/06/17 177 lb (80.3 kg)  03/29/16 168 lb 12.8 oz (76.6 kg)    Physical Exam  Constitutional: He is oriented to person, place, and time. He appears well-developed. No distress.  NAD  HENT:  Mouth/Throat: Oropharynx is clear and moist.  Eyes: Conjunctivae are normal. Pupils are equal, round, and reactive to light.  Neck: Normal range of motion. No JVD present. No thyromegaly present.  Cardiovascular: Normal rate, regular rhythm, normal heart sounds and intact distal pulses. Exam reveals no gallop and no friction rub.  No murmur heard. Pulmonary/Chest: Effort normal and breath sounds normal. No respiratory distress. He has no wheezes. He has no rales. He exhibits no tenderness.  Abdominal: Soft. Bowel sounds are normal. He exhibits no distension and no mass. There is no tenderness. There is no rebound and no guarding.  Genitourinary: Rectum normal and prostate normal. Rectal exam shows guaiac negative stool.  Musculoskeletal: Normal range of motion. He exhibits no edema or tenderness.  Lymphadenopathy:    He has no cervical adenopathy.  Neurological: He is alert and oriented to person, place, and time. He has normal reflexes. No cranial nerve deficit. He exhibits normal muscle tone. He displays a negative Romberg sign. Coordination and gait normal.  Skin: Skin is warm and dry. No rash noted.  Psychiatric: He has a normal mood and affect. His  behavior is normal. Judgment and thought content normal.    Lab Results  Component Value Date   WBC 7.1 01/06/2017   HGB 14.9 01/06/2017   HCT 42.9 01/06/2017   PLT 231.0 01/06/2017   GLUCOSE 98 01/06/2017   CHOL 214 (H) 01/06/2017   TRIG 183.0 (H) 01/06/2017   HDL 42.50 01/06/2017   LDLDIRECT 144.2 11/06/2012   LDLCALC 135 (H) 01/06/2017   ALT 27 01/06/2017   AST 22  01/06/2017   NA 142 01/06/2017   K 4.2 01/06/2017   CL 107 01/06/2017   CREATININE 0.92 01/06/2017   BUN 16 01/06/2017   CO2 29 01/06/2017   TSH 1.45 01/06/2017   PSA 3.41 01/06/2017    Dg Chest 2 View  Result Date: 02/14/2014 CLINICAL DATA:  Fatigue and cough. EXAM: CHEST  2 VIEW COMPARISON:  08/26/2007. FINDINGS: The heart size and mediastinal contours are within normal limits. Both lungs are clear. The visualized skeletal structures are unremarkable. IMPRESSION: No active cardiopulmonary disease. Electronically Signed   By: Loralie ChampagneMark  Gallerani M.D.   On: 02/14/2014 17:14    Assessment & Plan:   There are no diagnoses linked to this encounter. I am having Zaydyn Brissette maintain his Cholecalciferol, Omega-3 Fatty Acids (FISH OIL PO), Vitamin B-12, ergocalciferol, and alfuzosin.  No orders of the defined types were placed in this encounter.    Follow-up: No Follow-up on file.  Sonda PrimesAlex Deryk Bozman, MD

## 2018-10-29 DIAGNOSIS — H02402 Unspecified ptosis of left eyelid: Secondary | ICD-10-CM | POA: Diagnosis not present

## 2018-11-17 DIAGNOSIS — H02409 Unspecified ptosis of unspecified eyelid: Secondary | ICD-10-CM | POA: Insufficient documentation

## 2018-11-18 DIAGNOSIS — H02409 Unspecified ptosis of unspecified eyelid: Secondary | ICD-10-CM | POA: Diagnosis not present

## 2018-11-18 DIAGNOSIS — H532 Diplopia: Secondary | ICD-10-CM | POA: Insufficient documentation

## 2018-11-18 DIAGNOSIS — H02402 Unspecified ptosis of left eyelid: Secondary | ICD-10-CM | POA: Diagnosis not present

## 2018-11-23 DIAGNOSIS — H02409 Unspecified ptosis of unspecified eyelid: Secondary | ICD-10-CM | POA: Diagnosis not present

## 2018-11-23 DIAGNOSIS — H532 Diplopia: Secondary | ICD-10-CM | POA: Diagnosis not present

## 2018-12-08 ENCOUNTER — Encounter: Payer: BLUE CROSS/BLUE SHIELD | Admitting: Internal Medicine

## 2018-12-09 ENCOUNTER — Ambulatory Visit: Payer: Medicaid Other | Admitting: Internal Medicine

## 2018-12-11 ENCOUNTER — Encounter

## 2018-12-11 ENCOUNTER — Encounter: Payer: Medicaid Other | Admitting: Internal Medicine

## 2019-01-05 DIAGNOSIS — E559 Vitamin D deficiency, unspecified: Secondary | ICD-10-CM | POA: Diagnosis not present

## 2019-01-05 DIAGNOSIS — Z0000 Encounter for general adult medical examination without abnormal findings: Secondary | ICD-10-CM | POA: Diagnosis not present

## 2019-01-05 DIAGNOSIS — Z1211 Encounter for screening for malignant neoplasm of colon: Secondary | ICD-10-CM | POA: Diagnosis not present

## 2019-01-05 DIAGNOSIS — Z125 Encounter for screening for malignant neoplasm of prostate: Secondary | ICD-10-CM | POA: Diagnosis not present

## 2019-01-05 DIAGNOSIS — E538 Deficiency of other specified B group vitamins: Secondary | ICD-10-CM | POA: Diagnosis not present

## 2019-09-23 ENCOUNTER — Ambulatory Visit (INDEPENDENT_AMBULATORY_CARE_PROVIDER_SITE_OTHER): Payer: BC Managed Care – PPO | Admitting: Internal Medicine

## 2019-09-23 ENCOUNTER — Other Ambulatory Visit: Payer: Self-pay

## 2019-09-23 ENCOUNTER — Encounter: Payer: Self-pay | Admitting: Internal Medicine

## 2019-09-23 ENCOUNTER — Other Ambulatory Visit (INDEPENDENT_AMBULATORY_CARE_PROVIDER_SITE_OTHER): Payer: BC Managed Care – PPO

## 2019-09-23 VITALS — BP 128/82 | HR 56 | Temp 97.7°F | Ht 66.0 in | Wt 175.0 lb

## 2019-09-23 DIAGNOSIS — Z0000 Encounter for general adult medical examination without abnormal findings: Secondary | ICD-10-CM

## 2019-09-23 DIAGNOSIS — E538 Deficiency of other specified B group vitamins: Secondary | ICD-10-CM

## 2019-09-23 DIAGNOSIS — E559 Vitamin D deficiency, unspecified: Secondary | ICD-10-CM

## 2019-09-23 DIAGNOSIS — Z125 Encounter for screening for malignant neoplasm of prostate: Secondary | ICD-10-CM

## 2019-09-23 LAB — HEPATIC FUNCTION PANEL
ALT: 25 U/L (ref 0–53)
AST: 24 U/L (ref 0–37)
Albumin: 4.4 g/dL (ref 3.5–5.2)
Alkaline Phosphatase: 77 U/L (ref 39–117)
Bilirubin, Direct: 0.1 mg/dL (ref 0.0–0.3)
Total Bilirubin: 0.7 mg/dL (ref 0.2–1.2)
Total Protein: 7.2 g/dL (ref 6.0–8.3)

## 2019-09-23 LAB — CBC WITH DIFFERENTIAL/PLATELET
Basophils Absolute: 0.1 10*3/uL (ref 0.0–0.1)
Basophils Relative: 1 % (ref 0.0–3.0)
Eosinophils Absolute: 0.3 10*3/uL (ref 0.0–0.7)
Eosinophils Relative: 4.9 % (ref 0.0–5.0)
HCT: 45.1 % (ref 39.0–52.0)
Hemoglobin: 15.4 g/dL (ref 13.0–17.0)
Lymphocytes Relative: 37.3 % (ref 12.0–46.0)
Lymphs Abs: 2.1 10*3/uL (ref 0.7–4.0)
MCHC: 34.2 g/dL (ref 30.0–36.0)
MCV: 88 fl (ref 78.0–100.0)
Monocytes Absolute: 0.3 10*3/uL (ref 0.1–1.0)
Monocytes Relative: 5.7 % (ref 3.0–12.0)
Neutro Abs: 2.8 10*3/uL (ref 1.4–7.7)
Neutrophils Relative %: 51.1 % (ref 43.0–77.0)
Platelets: 217 10*3/uL (ref 150.0–400.0)
RBC: 5.12 Mil/uL (ref 4.22–5.81)
RDW: 13.5 % (ref 11.5–15.5)
WBC: 5.5 10*3/uL (ref 4.0–10.5)

## 2019-09-23 LAB — URINALYSIS
Bilirubin Urine: NEGATIVE
Ketones, ur: NEGATIVE
Leukocytes,Ua: NEGATIVE
Nitrite: NEGATIVE
Specific Gravity, Urine: 1.02 (ref 1.000–1.030)
Total Protein, Urine: NEGATIVE
Urine Glucose: NEGATIVE
Urobilinogen, UA: 0.2 (ref 0.0–1.0)
pH: 6 (ref 5.0–8.0)

## 2019-09-23 LAB — BASIC METABOLIC PANEL
BUN: 11 mg/dL (ref 6–23)
CO2: 26 mEq/L (ref 19–32)
Calcium: 9.5 mg/dL (ref 8.4–10.5)
Chloride: 105 mEq/L (ref 96–112)
Creatinine, Ser: 0.84 mg/dL (ref 0.40–1.50)
GFR: 95.52 mL/min (ref >60.00)
Glucose, Bld: 104 mg/dL — ABNORMAL HIGH (ref 70–99)
Potassium: 4.1 mEq/L (ref 3.5–5.1)
Sodium: 139 mEq/L (ref 135–145)

## 2019-09-23 LAB — LIPID PANEL
Cholesterol: 226 mg/dL — ABNORMAL HIGH (ref 0–200)
HDL: 43.6 mg/dL (ref >39.00)
LDL Cholesterol: 150 mg/dL — ABNORMAL HIGH (ref 0–99)
NonHDL: 182.58
Total CHOL/HDL Ratio: 5
Triglycerides: 161 mg/dL — ABNORMAL HIGH (ref 0.0–149.0)
VLDL: 32.2 mg/dL (ref 0.0–40.0)

## 2019-09-23 LAB — VITAMIN B12: Vitamin B-12: 190 pg/mL — ABNORMAL LOW (ref 211–911)

## 2019-09-23 LAB — TSH: TSH: 1.66 u[IU]/mL (ref 0.35–4.50)

## 2019-09-23 LAB — PSA: PSA: 3.44 ng/mL (ref 0.10–4.00)

## 2019-09-23 LAB — VITAMIN D 25 HYDROXY (VIT D DEFICIENCY, FRACTURES): VITD: 29.93 ng/mL — ABNORMAL LOW (ref 30.00–100.00)

## 2019-09-23 MED ORDER — VITAMIN B-12 1000 MCG SL SUBL: 1.0000 | SUBLINGUAL_TABLET | Freq: Every day | SUBLINGUAL | 3 refills | Status: AC

## 2019-09-23 MED ORDER — VITAMIN D3 50 MCG (2000 UT) PO CAPS: 2000.0000 [IU] | ORAL_CAPSULE | Freq: Every day | ORAL | 3 refills | Status: AC

## 2019-09-23 MED ORDER — ALFUZOSIN HCL ER 10 MG PO TB24
10.0000 mg | ORAL_TABLET | Freq: Every day | ORAL | 11 refills | Status: DC
Start: 2019-09-23 — End: 2020-09-27

## 2019-09-23 NOTE — Assessment & Plan Note (Signed)
On B12 - not taking Risks associated with treatment noncompliance were discussed. Compliance was encouraged. Labs

## 2019-09-23 NOTE — Assessment & Plan Note (Signed)
Labs

## 2019-09-23 NOTE — Patient Instructions (Addendum)
Wt Readings from Last 3 Encounters:  09/23/19 175 lb (79.4 kg)  11/26/17 179 lb (81.2 kg)  01/06/17 177 lb (80.3 kg)   Cardiac CT calcium scoring test $150   Computed tomography, more commonly known as a CT or CAT scan, is a diagnostic medical imaging test. Like traditional x-rays, it produces multiple images or pictures of the inside of the body. The cross-sectional images generated during a CT scan can be reformatted in multiple planes. They can even generate three-dimensional images. These images can be viewed on a computer monitor, printed on film or by a 3D printer, or transferred to a CD or DVD. CT images of internal organs, bones, soft tissue and blood vessels provide greater detail than traditional x-rays, particularly of soft tissues and blood vessels. A cardiac CT scan for coronary calcium is a non-invasive way of obtaining information about the presence, location and extent of calcified plaque in the coronary arteries-the vessels that supply oxygen-containing blood to the heart muscle. Calcified plaque results when there is a build-up of fat and other substances under the inner layer of the artery. This material can calcify which signals the presence of atherosclerosis, a disease of the vessel wall, also called coronary artery disease (CAD). People with this disease have an increased risk for heart attacks. In addition, over time, progression of plaque build up (CAD) can narrow the arteries or even close off blood flow to the heart. The result may be chest pain, sometimes called "angina," or a heart attack. Because calcium is a marker of CAD, the amount of calcium detected on a cardiac CT scan is a helpful prognostic tool. The findings on cardiac CT are expressed as a calcium score. Another name for this test is coronary artery calcium scoring.  What are some common uses of the procedure? The goal of cardiac CT scan for calcium scoring is to determine if CAD is present and to what extent,  even if there are no symptoms. It is a screening study that may be recommended by a physician for patients with risk factors for CAD but no clinical symptoms. The major risk factors for CAD are: . high blood cholesterol levels  . family history of heart attacks  . diabetes  . high blood pressure  . cigarette smoking  . overweight or obese  . physical inactivity   A negative cardiac CT scan for calcium scoring shows no calcification within the coronary arteries. This suggests that CAD is absent or so minimal it cannot be seen by this technique. The chance of having a heart attack over the next two to five years is very low under these circumstances. A positive test means that CAD is present, regardless of whether or not the patient is experiencing any symptoms. The amount of calcification-expressed as the calcium score-may help to predict the likelihood of a myocardial infarction (heart attack) in the coming years and helps your medical doctor or cardiologist decide whether the patient may need to take preventive medicine or undertake other measures such as diet and exercise to lower the risk for heart attack. The extent of CAD is graded according to your calcium score:  Calcium Score  Presence of CAD (coronary artery disease)  0 No evidence of CAD   1-10 Minimal evidence of CAD  11-100 Mild evidence of CAD  101-400 Moderate evidence of CAD  Over 400 Extensive evidence of CAD

## 2019-09-23 NOTE — Progress Notes (Signed)
Subjective:  Patient ID: Andrew Salazar, male    DOB: 02/22/1966  Age: 53 y.o. MRN: [id number]  CC: No chief complaint on file.   HPI Andrew Salazar presents for a well exam Not taking B12  Outpatient Medications Prior to Visit  Medication Sig Dispense Refill  . alfuzosin (UROXATRAL) 10 MG 24 hr tablet Take 1 tablet (10 mg total) by mouth daily with breakfast. 30 tablet 11  . Cholecalciferol 1000 UNITS tablet Take 1,000 Units by mouth daily. Reported on 03/29/2016    . Cyanocobalamin (VITAMIN B-12) 1000 MCG SUBL Place 1 tablet (1,000 mcg total) under the tongue daily. 100 tablet 3  . Omega-3 Fatty Acids (FISH OIL PO) Take 1 each by mouth daily. Reported on 03/29/2016     No facility-administered medications prior to visit.     ROS: Review of Systems  Constitutional: Negative for appetite change, fatigue and unexpected weight change.  HENT: Negative for congestion, nosebleeds, sneezing, sore throat and trouble swallowing.   Eyes: Negative for itching and visual disturbance.  Respiratory: Negative for cough.   Cardiovascular: Negative for chest pain, palpitations and leg swelling.  Gastrointestinal: Negative for abdominal distention, blood in stool, diarrhea and nausea.  Genitourinary: Negative for frequency and hematuria.  Musculoskeletal: Negative for back pain, gait problem, joint swelling and neck pain.  Skin: Negative for rash.  Neurological: Negative for dizziness, tremors, speech difficulty and weakness.  Psychiatric/Behavioral: Negative for agitation, dysphoric mood and sleep disturbance. The patient is not nervous/anxious.     Objective:  BP 128/82 (BP Location: Left Arm, Patient Position: Sitting, Cuff Size: Normal)   Pulse (!) 56   Temp 97.7 F (36.5 C) (Oral)   Ht 5\' 6"  (1.676 m)   Wt 175 lb (79.4 kg)   SpO2 97%   BMI 28.25 kg/m   BP Readings from Last 3 Encounters:  09/23/19 128/82  11/26/17 124/82  01/06/17 120/80    Wt Readings from Last 3 Encounters:   09/23/19 175 lb (79.4 kg)  11/26/17 179 lb (81.2 kg)  01/06/17 177 lb (80.3 kg)    Physical Exam Constitutional:      General: He is not in acute distress.    Appearance: He is well-developed.     Comments: NAD  Eyes:     Conjunctiva/sclera: Conjunctivae normal.     Pupils: Pupils are equal, round, and reactive to light.  Neck:     Musculoskeletal: Normal range of motion.     Thyroid: No thyromegaly.     Vascular: No JVD.  Cardiovascular:     Rate and Rhythm: Normal rate and regular rhythm.     Heart sounds: Normal heart sounds. No murmur. No friction rub. No gallop.   Pulmonary:     Effort: Pulmonary effort is normal. No respiratory distress.     Breath sounds: Normal breath sounds. No wheezing or rales.  Chest:     Chest wall: No tenderness.  Abdominal:     General: Bowel sounds are normal. There is no distension.     Palpations: Abdomen is soft. There is no mass.     Tenderness: There is no abdominal tenderness. There is no guarding or rebound.  Genitourinary:    Rectum: Normal. Guaiac result negative.  Musculoskeletal: Normal range of motion.        General: No tenderness.  Lymphadenopathy:     Cervical: No cervical adenopathy.  Skin:    General: Skin is warm and dry.     Findings: No rash.  Neurological:  Mental Status: He is alert and oriented to person, place, and time.     Cranial Nerves: No cranial nerve deficit.     Motor: No abnormal muscle tone.     Coordination: Coordination normal.     Gait: Gait normal.     Deep Tendon Reflexes: Reflexes are normal and symmetric.  Psychiatric:        Behavior: Behavior normal.        Thought Content: Thought content normal.        Judgment: Judgment normal.   prostat 1+  Lab Results  Component Value Date   WBC 6.3 11/26/2017   HGB 15.3 11/26/2017   HCT 45.0 11/26/2017   PLT 225.0 11/26/2017   GLUCOSE 91 11/26/2017   CHOL 189 11/26/2017   TRIG 150.0 (H) 11/26/2017   HDL 39.90 11/26/2017   LDLDIRECT 144.2  11/06/2012   LDLCALC 119 (H) 11/26/2017   ALT 20 11/26/2017   AST 22 11/26/2017   NA 140 11/26/2017   K 4.2 11/26/2017   CL 104 11/26/2017   CREATININE 0.85 11/26/2017   BUN 8 11/26/2017   CO2 29 11/26/2017   TSH 1.63 11/26/2017   PSA 3.41 01/06/2017    Dg Chest 2 View  Result Date: 02/14/2014 CLINICAL DATA:  Fatigue and cough. EXAM: CHEST  2 VIEW COMPARISON:  08/26/2007. FINDINGS: The heart size and mediastinal contours are within normal limits. Both lungs are clear. The visualized skeletal structures are unremarkable. IMPRESSION: No active cardiopulmonary disease. Electronically Signed   By: Kalman Jewels M.D.   On: 02/14/2014 17:14    Assessment & Plan:   Diagnoses and all orders for this visit:  Well adult exam     No orders of the defined types were placed in this encounter.    Follow-up: No follow-ups on file.  Walker Kehr, MD

## 2019-09-23 NOTE — Assessment & Plan Note (Addendum)
We discussed age appropriate health related issues, including available/recomended screening tests and vaccinations. We discussed a need for adhering to healthy diet and exercise. Labs were ordered to be later reviewed . All questions were answered. cologuard 2017 A cardiac CT scan for calcium scoring

## 2019-09-24 ENCOUNTER — Other Ambulatory Visit: Payer: Self-pay | Admitting: Internal Medicine

## 2019-09-24 MED ORDER — VITAMIN D3 1.25 MG (50000 UT) PO CAPS: 1.0000 | ORAL_CAPSULE | ORAL | 0 refills | Status: DC

## 2019-10-06 DIAGNOSIS — R05 Cough: Secondary | ICD-10-CM | POA: Diagnosis not present

## 2019-10-06 DIAGNOSIS — R0981 Nasal congestion: Secondary | ICD-10-CM | POA: Diagnosis not present

## 2019-10-06 DIAGNOSIS — Z20828 Contact with and (suspected) exposure to other viral communicable diseases: Secondary | ICD-10-CM | POA: Diagnosis not present

## 2019-10-06 DIAGNOSIS — K0889 Other specified disorders of teeth and supporting structures: Secondary | ICD-10-CM | POA: Diagnosis not present

## 2019-10-06 DIAGNOSIS — J069 Acute upper respiratory infection, unspecified: Secondary | ICD-10-CM | POA: Diagnosis not present

## 2019-10-06 DIAGNOSIS — J029 Acute pharyngitis, unspecified: Secondary | ICD-10-CM | POA: Diagnosis not present

## 2019-10-06 DIAGNOSIS — G8929 Other chronic pain: Secondary | ICD-10-CM | POA: Diagnosis not present

## 2019-10-06 DIAGNOSIS — R519 Headache, unspecified: Secondary | ICD-10-CM | POA: Diagnosis not present

## 2019-11-02 ENCOUNTER — Other Ambulatory Visit: Payer: Self-pay | Admitting: Internal Medicine

## 2019-11-20 DIAGNOSIS — Z03818 Encounter for observation for suspected exposure to other biological agents ruled out: Secondary | ICD-10-CM | POA: Diagnosis not present

## 2020-09-27 ENCOUNTER — Encounter: Payer: Self-pay | Admitting: Internal Medicine

## 2020-09-27 ENCOUNTER — Ambulatory Visit (INDEPENDENT_AMBULATORY_CARE_PROVIDER_SITE_OTHER): Payer: BC Managed Care – PPO | Admitting: Internal Medicine

## 2020-09-27 ENCOUNTER — Other Ambulatory Visit: Payer: Self-pay

## 2020-09-27 VITALS — BP 132/80 | HR 55 | Temp 98.2°F

## 2020-09-27 DIAGNOSIS — R300 Dysuria: Secondary | ICD-10-CM | POA: Diagnosis not present

## 2020-09-27 DIAGNOSIS — Z0000 Encounter for general adult medical examination without abnormal findings: Secondary | ICD-10-CM | POA: Diagnosis not present

## 2020-09-27 DIAGNOSIS — E559 Vitamin D deficiency, unspecified: Secondary | ICD-10-CM | POA: Diagnosis not present

## 2020-09-27 DIAGNOSIS — E538 Deficiency of other specified B group vitamins: Secondary | ICD-10-CM

## 2020-09-27 DIAGNOSIS — N320 Bladder-neck obstruction: Secondary | ICD-10-CM

## 2020-09-27 DIAGNOSIS — G43109 Migraine with aura, not intractable, without status migrainosus: Secondary | ICD-10-CM | POA: Diagnosis not present

## 2020-09-27 LAB — URINALYSIS
Bilirubin Urine: NEGATIVE
Ketones, ur: NEGATIVE
Leukocytes,Ua: NEGATIVE
Nitrite: NEGATIVE
Specific Gravity, Urine: >=1.03 — AB (ref 1.000–1.030)
Total Protein, Urine: NEGATIVE
Urine Glucose: NEGATIVE
Urobilinogen, UA: 0.2 (ref 0.0–1.0)
pH: 6 (ref 5.0–8.0)

## 2020-09-27 LAB — CBC WITH DIFFERENTIAL/PLATELET
Basophils Absolute: 0.1 10*3/uL (ref 0.0–0.1)
Basophils Relative: 0.9 % (ref 0.0–3.0)
Eosinophils Absolute: 0.3 10*3/uL (ref 0.0–0.7)
Eosinophils Relative: 5.1 % — ABNORMAL HIGH (ref 0.0–5.0)
HCT: 42.1 % (ref 39.0–52.0)
Hemoglobin: 14.4 g/dL (ref 13.0–17.0)
Lymphocytes Relative: 30.4 % (ref 12.0–46.0)
Lymphs Abs: 2 10*3/uL (ref 0.7–4.0)
MCHC: 34.2 g/dL (ref 30.0–36.0)
MCV: 87.4 fl (ref 78.0–100.0)
Monocytes Absolute: 0.4 10*3/uL (ref 0.1–1.0)
Monocytes Relative: 6.1 % (ref 3.0–12.0)
Neutro Abs: 3.8 10*3/uL (ref 1.4–7.7)
Neutrophils Relative %: 57.5 % (ref 43.0–77.0)
Platelets: 207 10*3/uL (ref 150.0–400.0)
RBC: 4.82 Mil/uL (ref 4.22–5.81)
RDW: 13.7 % (ref 11.5–15.5)
WBC: 6.6 10*3/uL (ref 4.0–10.5)

## 2020-09-27 LAB — COMPREHENSIVE METABOLIC PANEL
ALT: 20 U/L (ref 0–53)
AST: 20 U/L (ref 0–37)
Albumin: 4.2 g/dL (ref 3.5–5.2)
Alkaline Phosphatase: 69 U/L (ref 39–117)
BUN: 19 mg/dL (ref 6–23)
CO2: 28 mEq/L (ref 19–32)
Calcium: 9.4 mg/dL (ref 8.4–10.5)
Chloride: 104 mEq/L (ref 96–112)
Creatinine, Ser: 0.96 mg/dL (ref 0.40–1.50)
GFR: 89.12 mL/min (ref >60.00)
Glucose, Bld: 97 mg/dL (ref 70–99)
Potassium: 3.9 mEq/L (ref 3.5–5.1)
Sodium: 138 mEq/L (ref 135–145)
Total Bilirubin: 0.5 mg/dL (ref 0.2–1.2)
Total Protein: 6.9 g/dL (ref 6.0–8.3)

## 2020-09-27 LAB — LIPID PANEL
Cholesterol: 207 mg/dL — ABNORMAL HIGH (ref 0–200)
HDL: 42.7 mg/dL (ref >39.00)
LDL Cholesterol: 132 mg/dL — ABNORMAL HIGH (ref 0–99)
NonHDL: 164.31
Total CHOL/HDL Ratio: 5
Triglycerides: 163 mg/dL — ABNORMAL HIGH (ref 0.0–149.0)
VLDL: 32.6 mg/dL (ref 0.0–40.0)

## 2020-09-27 LAB — VITAMIN D 25 HYDROXY (VIT D DEFICIENCY, FRACTURES): VITD: 34.3 ng/mL (ref 30.00–100.00)

## 2020-09-27 LAB — VITAMIN B12: Vitamin B-12: 408 pg/mL (ref 211–911)

## 2020-09-27 LAB — TSH: TSH: 2.01 u[IU]/mL (ref 0.35–4.50)

## 2020-09-27 LAB — PSA: PSA: 3.31 ng/mL (ref 0.10–4.00)

## 2020-09-27 MED ORDER — ALFUZOSIN HCL ER 10 MG PO TB24
10.0000 mg | ORAL_TABLET | Freq: Every day | ORAL | 3 refills | Status: DC
Start: 2020-09-27 — End: 2020-10-13

## 2020-09-27 NOTE — Assessment & Plan Note (Signed)

## 2020-09-27 NOTE — Assessment & Plan Note (Signed)
labs

## 2020-09-27 NOTE — Patient Instructions (Signed)

## 2020-09-27 NOTE — Assessment & Plan Note (Signed)
On B12 Labs 

## 2020-09-27 NOTE — Progress Notes (Signed)
Subjective:  Patient ID: Andrew Salazar, male    DOB: 1966-07-02  Age: 53 y.o. MRN: [id number]  CC: Annual Exam   HPI Andrew Salazar presents for a well exam C/o pain w/urination at times C/o 1/2 visual field loss at times w/a mild HA after  Outpatient Medications Prior to Visit  Medication Sig Dispense Refill  . alfuzosin (UROXATRAL) 10 MG 24 hr tablet Take 1 tablet (10 mg total) by mouth daily with breakfast. 30 tablet 11  . Cholecalciferol (VITAMIN D3) 1.25 MG (50000 UT) CAPS TAKE 1 CAPSULE BY MOUTH 1 TIME A WEEK 6 capsule 0  . Cholecalciferol (VITAMIN D3) 50 MCG (2000 UT) capsule Take 1 capsule (2,000 Units total) by mouth daily. 100 capsule 3  . Cyanocobalamin (VITAMIN B-12) 1000 MCG SUBL Place 1 tablet (1,000 mcg total) under the tongue daily. 90 tablet 3   No facility-administered medications prior to visit.    ROS: Review of Systems  Constitutional: Negative for appetite change, fatigue and unexpected weight change.  HENT: Negative for congestion, nosebleeds, sneezing, sore throat and trouble swallowing.   Eyes: Positive for visual disturbance. Negative for itching.  Respiratory: Negative for cough.   Cardiovascular: Negative for chest pain, palpitations and leg swelling.  Gastrointestinal: Negative for abdominal distention, blood in stool, diarrhea and nausea.  Genitourinary: Positive for difficulty urinating, discharge, frequency and urgency. Negative for hematuria.  Musculoskeletal: Negative for back pain, gait problem, joint swelling and neck pain.  Skin: Negative for rash.  Neurological: Negative for dizziness, tremors, speech difficulty and weakness.  Psychiatric/Behavioral: Negative for agitation, dysphoric mood and sleep disturbance. The patient is not nervous/anxious.     Objective:  BP 132/80 (BP Location: Left Arm, Patient Position: Sitting, Cuff Size: Normal)   Pulse (!) 55   Temp 98.2 F (36.8 C) (Oral)   SpO2 97%   BP Readings from Last 3  Encounters:  09/27/20 132/80  09/23/19 128/82  11/26/17 124/82    Wt Readings from Last 3 Encounters:  09/23/19 175 lb (79.4 kg)  11/26/17 179 lb (81.2 kg)  01/06/17 177 lb (80.3 kg)    Physical Exam Constitutional:      General: He is not in acute distress.    Appearance: He is well-developed.     Comments: NAD  Eyes:     Conjunctiva/sclera: Conjunctivae normal.     Pupils: Pupils are equal, round, and reactive to light.  Neck:     Thyroid: No thyromegaly.     Vascular: No JVD.  Cardiovascular:     Rate and Rhythm: Normal rate and regular rhythm.     Heart sounds: Normal heart sounds. No murmur heard.  No friction rub. No gallop.   Pulmonary:     Effort: Pulmonary effort is normal. No respiratory distress.     Breath sounds: Normal breath sounds. No wheezing or rales.  Chest:     Chest wall: No tenderness.  Abdominal:     General: Bowel sounds are normal. There is no distension.     Palpations: Abdomen is soft. There is no mass.     Tenderness: There is no abdominal tenderness. There is no guarding or rebound.  Musculoskeletal:        General: No tenderness. Normal range of motion.     Cervical back: Normal range of motion.  Lymphadenopathy:     Cervical: No cervical adenopathy.  Skin:    General: Skin is warm and dry.     Findings: No rash.  Neurological:  Mental Status: He is alert and oriented to person, place, and time.     Cranial Nerves: No cranial nerve deficit.     Motor: No abnormal muscle tone.     Coordination: Coordination normal.     Gait: Gait normal.     Deep Tendon Reflexes: Reflexes are normal and symmetric.  Psychiatric:        Behavior: Behavior normal.        Thought Content: Thought content normal.        Judgment: Judgment normal.    I spent 22 minutes in addition to time for CPX wellness examination in preparing to see the patient by review of recent labs, imaging and procedures, obtaining and reviewing separately obtained history,  communicating with the patient, ordering medications, tests or procedures, and documenting clinical information in the EHR including the differential diagnosis, treatment, and any further evaluation and other management of  migraine aura, ophthalmic migraines, vitamin B12 and D deficiency compliance necessity.     Assessment & Plan Note by      Lab Results  Component Value Date   WBC 5.5 09/23/2019   HGB 15.4 09/23/2019   HCT 45.1 09/23/2019   PLT 217.0 09/23/2019   GLUCOSE 104 (H) 09/23/2019   CHOL 226 (H) 09/23/2019   TRIG 161.0 (H) 09/23/2019   HDL 43.60 09/23/2019   LDLDIRECT 144.2 11/06/2012   LDLCALC 150 (H) 09/23/2019   ALT 25 09/23/2019   AST 24 09/23/2019   NA 139 09/23/2019   K 4.1 09/23/2019   CL 105 09/23/2019   CREATININE 0.84 09/23/2019   BUN 11 09/23/2019   CO2 26 09/23/2019   TSH 1.66 09/23/2019   PSA 3.44 09/23/2019    DG Chest 2 View  Result Date: 02/14/2014 CLINICAL DATA:  Fatigue and cough. EXAM: CHEST  2 VIEW COMPARISON:  08/26/2007. FINDINGS: The heart size and mediastinal contours are within normal limits. Both lungs are clear. The visualized skeletal structures are unremarkable. IMPRESSION: No active cardiopulmonary disease. Electronically Signed   By: Loralie Champagne M.D.   On: 02/14/2014 17:14    Assessment & Plan:    Sonda Primes, MD

## 2020-09-27 NOTE — Assessment & Plan Note (Signed)
C/o 1/2 visual field loss at times w/a mild HA after

## 2020-09-27 NOTE — Assessment & Plan Note (Signed)
PSA, UA, GC/chlam US renal

## 2020-09-28 LAB — HIV ANTIBODY (ROUTINE TESTING W REFLEX): HIV 1&2 Ab, 4th Generation: NONREACTIVE

## 2020-09-29 NOTE — Addendum Note (Signed)
Addended by: Deatra James on: 09/29/2020 01:34 PM   Modules accepted: Orders

## 2020-09-29 NOTE — Progress Notes (Signed)
Had to re-enter US renal location was entered incorrectly on first order.Marland KitchenRaechel Salazar

## 2020-10-01 LAB — GC/CHLAMYDIA PROBE AMP
Chlamydia trachomatis, NAA: NEGATIVE
Neisseria Gonorrhoeae by PCR: NEGATIVE

## 2020-10-06 ENCOUNTER — Ambulatory Visit
Admission: RE | Admit: 2020-10-06 | Discharge: 2020-10-06 | Disposition: A | Discharge status: Home / Self Care | Payer: BC Managed Care – PPO | Source: Ambulatory Visit | Attending: Internal Medicine | Admitting: Internal Medicine

## 2020-10-06 DIAGNOSIS — R300 Dysuria: Secondary | ICD-10-CM

## 2020-10-06 DIAGNOSIS — Z0000 Encounter for general adult medical examination without abnormal findings: Secondary | ICD-10-CM

## 2020-10-06 DIAGNOSIS — N400 Enlarged prostate without lower urinary tract symptoms: Secondary | ICD-10-CM | POA: Diagnosis not present

## 2020-10-06 DIAGNOSIS — N320 Bladder-neck obstruction: Secondary | ICD-10-CM

## 2020-10-13 ENCOUNTER — Other Ambulatory Visit: Payer: Self-pay | Admitting: Internal Medicine

## 2021-01-04 ENCOUNTER — Encounter: Payer: Self-pay | Admitting: Internal Medicine

## 2021-01-04 ENCOUNTER — Telehealth (INDEPENDENT_AMBULATORY_CARE_PROVIDER_SITE_OTHER): Payer: Medicaid Other | Admitting: Internal Medicine

## 2021-01-04 ENCOUNTER — Other Ambulatory Visit: Payer: Self-pay

## 2021-01-04 DIAGNOSIS — R5383 Other fatigue: Secondary | ICD-10-CM

## 2021-01-04 DIAGNOSIS — U071 COVID-19: Secondary | ICD-10-CM | POA: Insufficient documentation

## 2021-01-04 DIAGNOSIS — J209 Acute bronchitis, unspecified: Secondary | ICD-10-CM

## 2021-01-04 DIAGNOSIS — E538 Deficiency of other specified B group vitamins: Secondary | ICD-10-CM

## 2021-01-04 MED ORDER — CEFDINIR 300 MG PO CAPS: 300.0000 mg | ORAL_CAPSULE | Freq: Two times a day (BID) | ORAL | 0 refills | Status: DC

## 2021-01-04 MED ORDER — METHYLPREDNISOLONE 4 MG PO TBPK: ORAL_TABLET | ORAL | 0 refills | Status: DC

## 2021-01-04 MED ORDER — HYDROCODONE-HOMATROPINE 5-1.5 MG/5ML PO SYRP: 5.0000 mL | ORAL_SOLUTION | Freq: Four times a day (QID) | ORAL | 0 refills | Status: DC | PRN

## 2021-01-04 NOTE — Assessment & Plan Note (Signed)
Post COVID fatigue discussed.  Rest more

## 2021-01-04 NOTE — Assessment & Plan Note (Signed)
He is taking B12 daily

## 2021-01-04 NOTE — Assessment & Plan Note (Signed)
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  Prescribed cefdinir for 10 days

## 2021-01-04 NOTE — Progress Notes (Signed)
Virtual Visit via Video Note  I connected with [name] on 01/04/21 at 10:00 AM EST by a video enabled telemedicine application and verified that I am speaking with the correct person using two identifiers.   I discussed the limitations of evaluation and management by telemedicine and the availability of in person appointments. The patient expressed understanding and agreed to proceed.  I was located at our [facility] office. The patient was at home. There was no one else present in the visit.   History of Present Illness: Andrew Salazar has been sick with a respiratory symptoms for 2 weeks.  She tested positive for COVID-19 on January 1.  She continues to have productive cough, sinus congestion, nosebleeds, some wheezing.  No fever.  He is not short of breath at rest. We did discuss his vitamin D and vitamin B12 deficiency and compliance with taking vitamins.  He has been compliant.   Observations/Objective: The patient appears to be in no acute distress, looks ok.  She is coughing during our video visit  Assessment and Plan:  See my Assessment and Plan. Follow Up Instructions:    I discussed the assessment and treatment plan with the patient. The patient was provided an opportunity to ask questions and all were answered. The patient agreed with the plan and demonstrated an understanding of the instructions.   The patient was advised to call back or seek an in-person evaluation if the symptoms worsen or if the condition fails to improve as anticipated.  I provided face-to-face time during this encounter. We were at different locations.   Sonda Primes, MD

## 2021-01-04 NOTE — Assessment & Plan Note (Signed)
Post COVID bronchitis.  He seems to have some bronchospasm.  Prescribed Medrol Dosepak.  Cefdinir for 10 days.  Cough syrup Hycodan for severe cough at night.  Call if not better.  We may need to obtain a chest x-ray

## 2021-08-08 ENCOUNTER — Ambulatory Visit (INDEPENDENT_AMBULATORY_CARE_PROVIDER_SITE_OTHER): Payer: 59 | Admitting: Internal Medicine

## 2021-08-08 ENCOUNTER — Encounter: Payer: Self-pay | Admitting: Internal Medicine

## 2021-08-08 ENCOUNTER — Other Ambulatory Visit: Payer: Self-pay

## 2021-08-08 VITALS — BP 132/80 | HR 52 | Temp 98.0°F | Ht 66.0 in | Wt 176.0 lb

## 2021-08-08 DIAGNOSIS — E669 Obesity, unspecified: Secondary | ICD-10-CM | POA: Insufficient documentation

## 2021-08-08 DIAGNOSIS — E538 Deficiency of other specified B group vitamins: Secondary | ICD-10-CM

## 2021-08-08 DIAGNOSIS — Z6830 Body mass index (BMI) 30.0-30.9, adult: Secondary | ICD-10-CM | POA: Diagnosis not present

## 2021-08-08 DIAGNOSIS — E559 Vitamin D deficiency, unspecified: Secondary | ICD-10-CM | POA: Diagnosis not present

## 2021-08-08 DIAGNOSIS — E6609 Other obesity due to excess calories: Secondary | ICD-10-CM | POA: Diagnosis not present

## 2021-08-08 DIAGNOSIS — K429 Umbilical hernia without obstruction or gangrene: Secondary | ICD-10-CM | POA: Diagnosis not present

## 2021-08-08 DIAGNOSIS — Z0000 Encounter for general adult medical examination without abnormal findings: Secondary | ICD-10-CM

## 2021-08-08 DIAGNOSIS — N320 Bladder-neck obstruction: Secondary | ICD-10-CM | POA: Diagnosis not present

## 2021-08-08 NOTE — Assessment & Plan Note (Signed)
On Vit D 

## 2021-08-08 NOTE — Progress Notes (Signed)
Subjective:  Patient ID: Andrew Salazar, male    DOB: 1965-12-28  Age: 55 y.o. MRN: [id number]  CC: Follow-up (6 month f/u)   HPI Andrew Salazar presents for pain in the belly button 2 wks ago - better F/u BPH sx's, B12 def and Vit D def   Outpatient Medications Prior to Visit  Medication Sig Dispense Refill   Cholecalciferol (VITAMIN D3) 50 MCG (2000 UT) capsule Take 1 capsule (2,000 Units total) by mouth daily. 100 capsule 3   Cyanocobalamin (VITAMIN B-12) 1000 MCG SUBL Place 1 tablet (1,000 mcg total) under the tongue daily. 90 tablet 3   alfuzosin (UROXATRAL) 10 MG 24 hr tablet TAKE 1 TABLET BY MOUTH DAILY WITH BREAKFAST (Patient not taking: Reported on 08/08/2021) 90 tablet 3   cefdinir (OMNICEF) 300 MG capsule Take 1 capsule (300 mg total) by mouth 2 (two) times daily. (Patient not taking: Reported on 08/08/2021) 20 capsule 0   HYDROcodone-homatropine (HYCODAN) 5-1.5 MG/5ML syrup Take 5 mLs by mouth every 6 (six) hours as needed for cough. (Patient not taking: Reported on 08/08/2021) 240 mL 0   methylPREDNISolone (MEDROL DOSEPAK) 4 MG TBPK tablet As directed (Patient not taking: Reported on 08/08/2021) 21 tablet 0   No facility-administered medications prior to visit.    ROS: Review of Systems  Constitutional:  Negative for appetite change, fatigue and unexpected weight change.  HENT:  Negative for congestion, nosebleeds, sneezing, sore throat and trouble swallowing.   Eyes:  Negative for itching and visual disturbance.  Respiratory:  Negative for cough.   Cardiovascular:  Negative for chest pain, palpitations and leg swelling.  Gastrointestinal:  Negative for abdominal distention, blood in stool, diarrhea and nausea.  Genitourinary:  Negative for frequency and hematuria.  Musculoskeletal:  Negative for back pain, gait problem, joint swelling and neck pain.  Skin:  Negative for rash.  Neurological:  Negative for dizziness, tremors, speech difficulty and weakness.   Psychiatric/Behavioral:  Negative for agitation, dysphoric mood and sleep disturbance. The patient is not nervous/anxious.    Objective:  BP 132/80 (BP Location: Left Arm)   Pulse (!) 52   Temp 98 F (36.7 C) (Oral)   Ht 5\' 6"  (1.676 m)   Wt 176 lb (79.8 kg)   SpO2 96%   BMI 28.41 kg/m   BP Readings from Last 3 Encounters:  08/08/21 132/80  09/27/20 132/80  09/23/19 128/82    Wt Readings from Last 3 Encounters:  08/08/21 176 lb (79.8 kg)  09/23/19 175 lb (79.4 kg)  11/26/17 179 lb (81.2 kg)    Physical Exam Constitutional:      General: He is not in acute distress.    Appearance: He is well-developed. He is obese.     Comments: NAD  Eyes:     Conjunctiva/sclera: Conjunctivae normal.     Pupils: Pupils are equal, round, and reactive to light.  Neck:     Thyroid: No thyromegaly.     Vascular: No JVD.  Cardiovascular:     Rate and Rhythm: Normal rate and regular rhythm.     Heart sounds: Normal heart sounds. No murmur heard.   No friction rub. No gallop.  Pulmonary:     Effort: Pulmonary effort is normal. No respiratory distress.     Breath sounds: Normal breath sounds. No wheezing or rales.  Chest:     Chest wall: No tenderness.  Abdominal:     General: Bowel sounds are normal. There is no distension.     Palpations: Abdomen  is soft. There is no mass.     Tenderness: There is no abdominal tenderness. There is no guarding or rebound.  Musculoskeletal:        General: No tenderness. Normal range of motion.     Cervical back: Normal range of motion.  Lymphadenopathy:     Cervical: No cervical adenopathy.  Skin:    General: Skin is warm and dry.     Findings: No rash.  Neurological:     Mental Status: He is alert and oriented to person, place, and time.     Cranial Nerves: No cranial nerve deficit.     Motor: No abnormal muscle tone.     Coordination: Coordination normal.     Gait: Gait normal.     Deep Tendon Reflexes: Reflexes are normal and symmetric.   Psychiatric:        Behavior: Behavior normal.        Thought Content: Thought content normal.        Judgment: Judgment normal.  A small umbilical hernia - NT. Obese abdomen  Lab Results  Component Value Date   WBC 6.6 09/27/2020   HGB 14.4 09/27/2020   HCT 42.1 09/27/2020   PLT 207.0 09/27/2020   GLUCOSE 97 09/27/2020   CHOL 207 (H) 09/27/2020   TRIG 163.0 (H) 09/27/2020   HDL 42.70 09/27/2020   LDLDIRECT 144.2 11/06/2012   LDLCALC 132 (H) 09/27/2020   ALT 20 09/27/2020   AST 20 09/27/2020   NA 138 09/27/2020   K 3.9 09/27/2020   CL 104 09/27/2020   CREATININE 0.96 09/27/2020   BUN 19 09/27/2020   CO2 28 09/27/2020   TSH 2.01 09/27/2020   PSA 3.31 09/27/2020    US Renal  Result Date: 10/08/2020 CLINICAL DATA:  Dysuria and prosthetic hyperplasia EXAM: RENAL / URINARY TRACT ULTRASOUND COMPLETE COMPARISON:  None. FINDINGS: Right Kidney: Renal measurements: 11.2 x 4.6 x 4.7 cm = volume: 128 mL. Echogenicity within normal limits. No mass or hydronephrosis visualized. Left Kidney: Renal measurements: 11.1 x 5.9 x 4.6 cm = volume: 152 mL. Echogenicity within normal limits. No mass or hydronephrosis visualized. Bladder: Appears normal for degree of bladder distention. Other: Prostate is enlarged with a transverse dimension measuring 5.7 cm. IMPRESSION: 1. Enlarged prostate. 2. Otherwise normal renal ultrasound. Electronically Signed   By: Deatra Robinson M.D.   On: 10/08/2020 20:00    Assessment & Plan:     Sonda Primes, MD

## 2021-08-08 NOTE — Assessment & Plan Note (Addendum)
Mild. Obese abdomen BMI 28 Low carb diet

## 2021-08-08 NOTE — Assessment & Plan Note (Signed)
Off Uroxatral Doing OK Urol ref was offered

## 2021-08-08 NOTE — Assessment & Plan Note (Addendum)
New A small umbilical hernia - NT Discussed. Info given

## 2021-08-08 NOTE — Assessment & Plan Note (Signed)
Cont w/Vit B12 

## 2021-08-08 NOTE — Patient Instructions (Signed)
The Obesity Code book by Jason Fung   These suggestions will probably help you to improve your metabolism if you are not overweight and to lose weight if you are overweight: 1.  Reduce your consumption of sugars and starches.  Eliminate high fructose corn syrup from your diet.  Reduce your consumption of processed foods.  For desserts try to have seasonal fruits, berries, nuts, cheeses or dark chocolate with more than 70% cacao. 2.  Do not snack 3.  You do not have to eat breakfast.  If you choose to have breakfast - eat plain greek yogurt, eggs, oatmeal (without sugar) - use honey if you need to. 4.  Drink water, freshly brewed unsweetened tea (green, black or herbal) or coffee.  Do not drink sodas including diet sodas , juices, beverages sweetened with artificial sweeteners. 5.  Reduce your consumption of refined grains. 6.  Avoid protein drinks such as Optifast, Slim fast etc. Eat chicken, fish, meat, dairy and beans for your sources of protein. 7.  Natural unprocessed fats like cold pressed virgin olive oil, butter, coconut oil are good for you.  Eat avocados. 8.  Increase your consumption of fiber.  Fruits, berries, vegetables, whole grains, flaxseed, chia seeds, beans, popcorn, nuts, oatmeal are good sources of fiber 9.  Use vinegar in your diet, i.e. apple cider vinegar, red wine or balsamic vinegar 10.  You can try fasting.  For example you can skip breakfast and lunch every other day (24-hour fast) 11.  Stress reduction, good night sleep, relaxation, meditation, yoga and other physical activity is likely to help you to maintain low weight too. 12.  If you drink alcohol, limit your alcohol intake to no more than 2 drinks a day. '''''''''''''''''''''''''''''''''''''''''''''''''''''''''''''''''''''''''''''''''''''''''''''''''''''''''''''''''''''''''''      

## 2021-10-24 IMAGING — US US RENAL
1 series · 14 of 25 positions shown · non-contrast
Comparison: None.

CLINICAL DATA: Dysuria and prosthetic hyperplasia

EXAM:
RENAL / URINARY TRACT ULTRASOUND COMPLETE

[Series 1: us renal · 0.23mm/px · 14 of 46 slices shown]
[im 1/46]
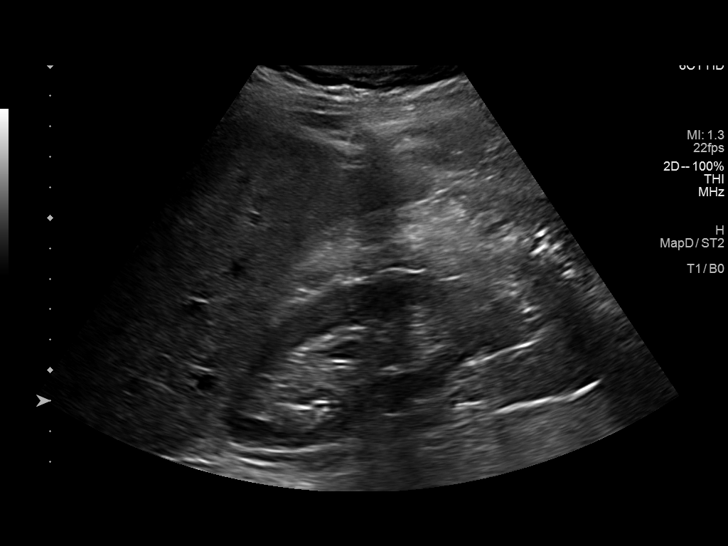
[im 4/46]
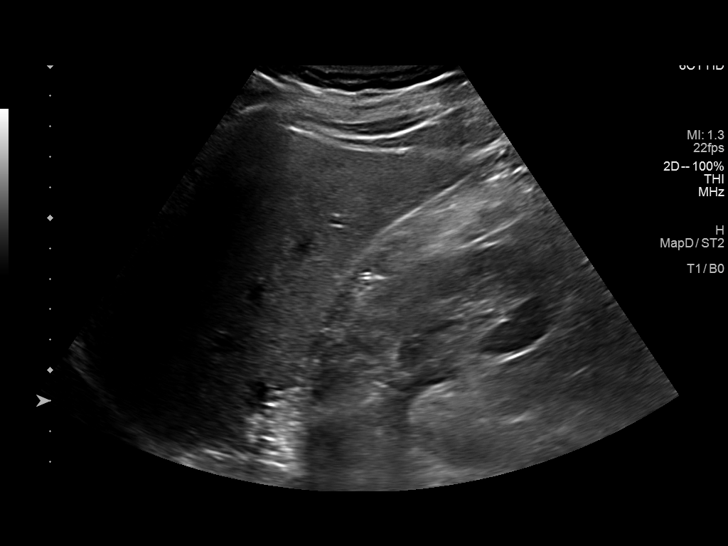
[im 8/46]
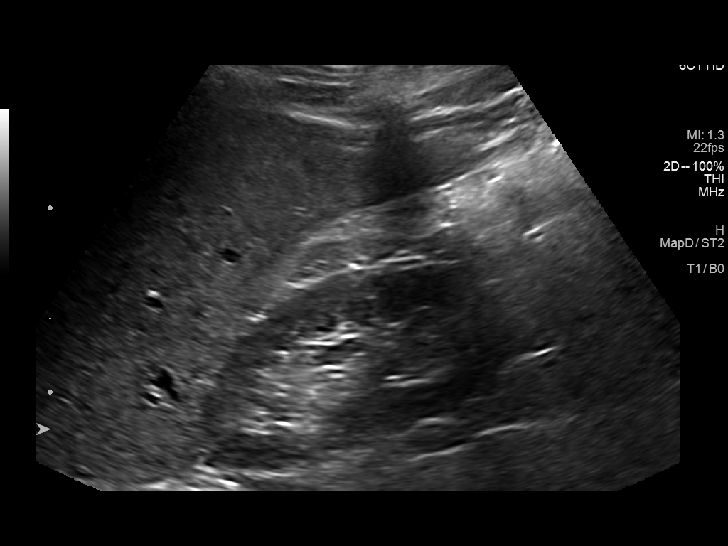
[im 12/46]
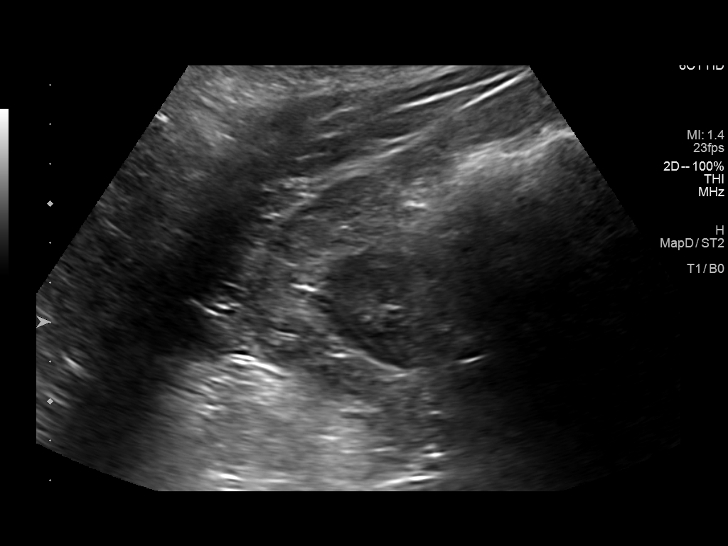
[im 16/46]
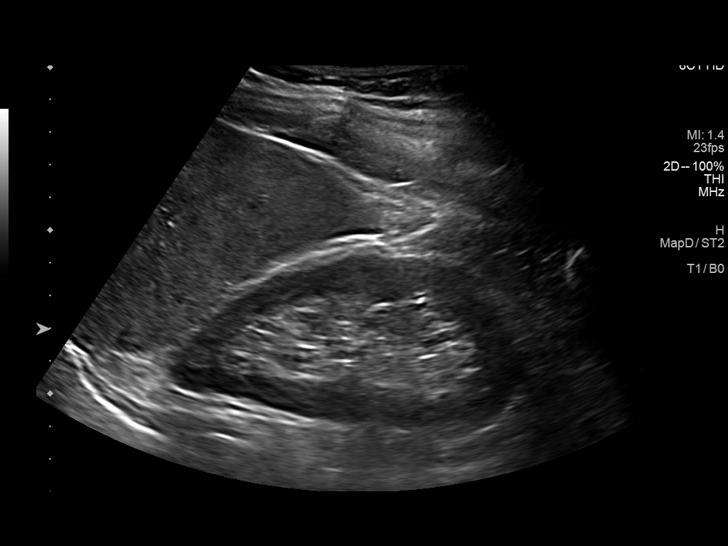
[im 17/46]
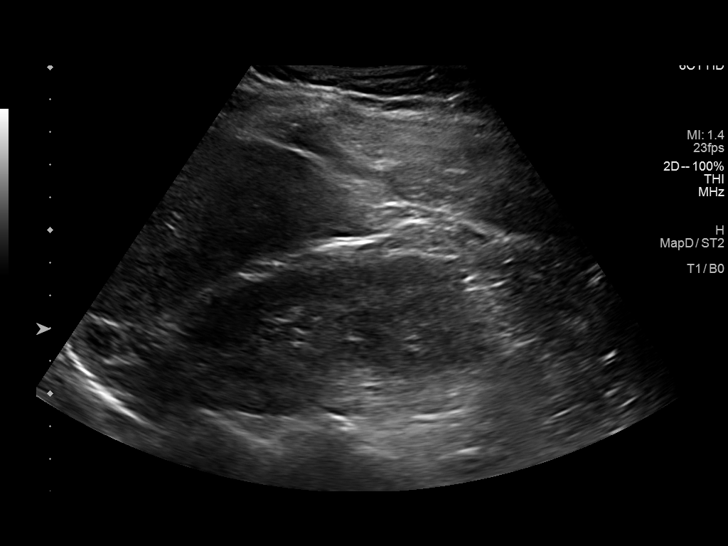
[im 21/46]
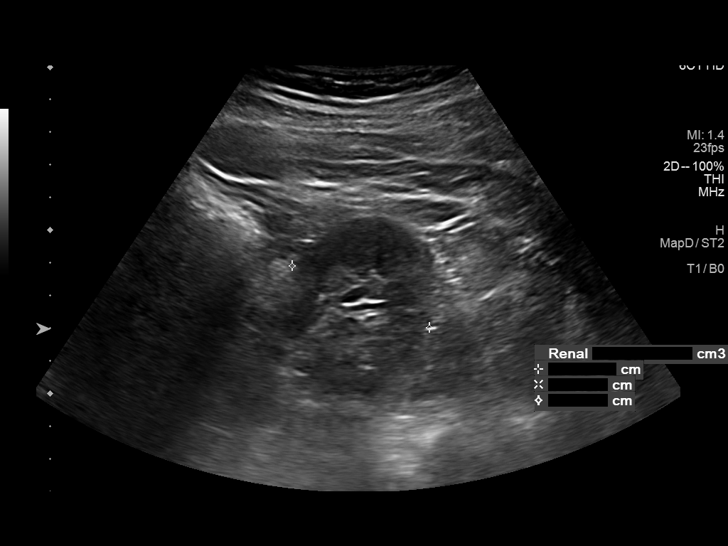
[im 25/46]
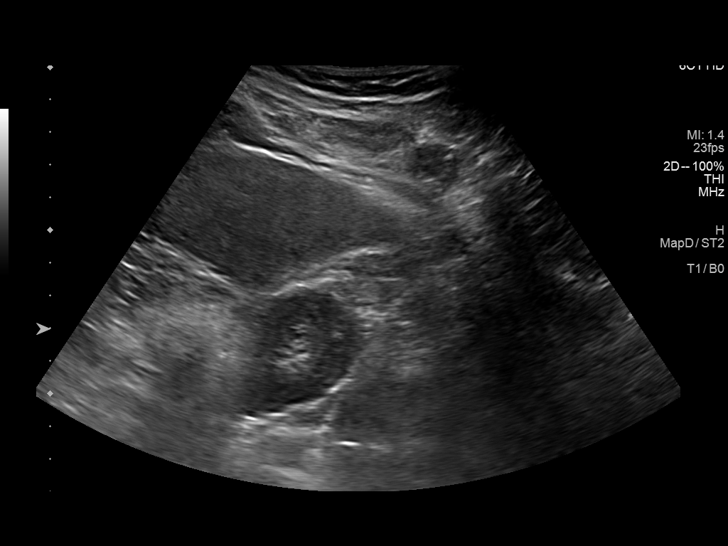
[im 29/46]
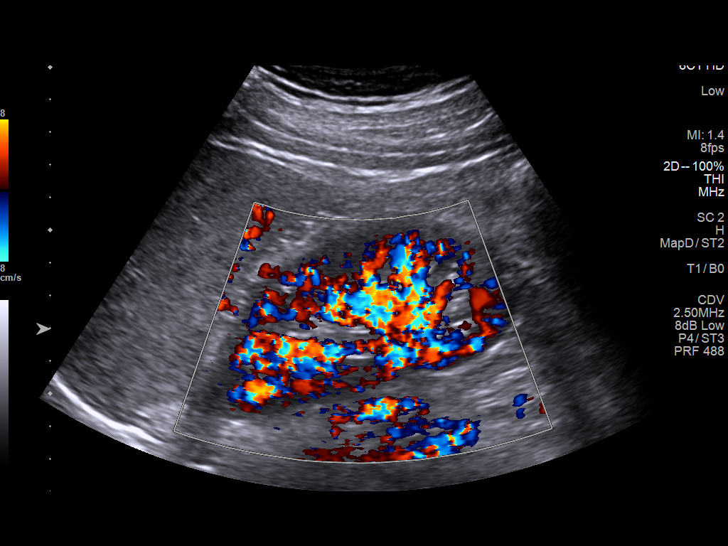
[im 31/46]
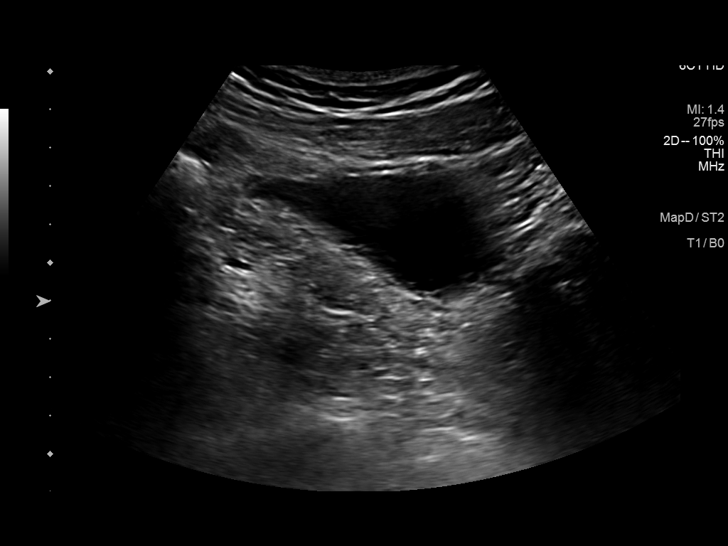
[im 34/46]
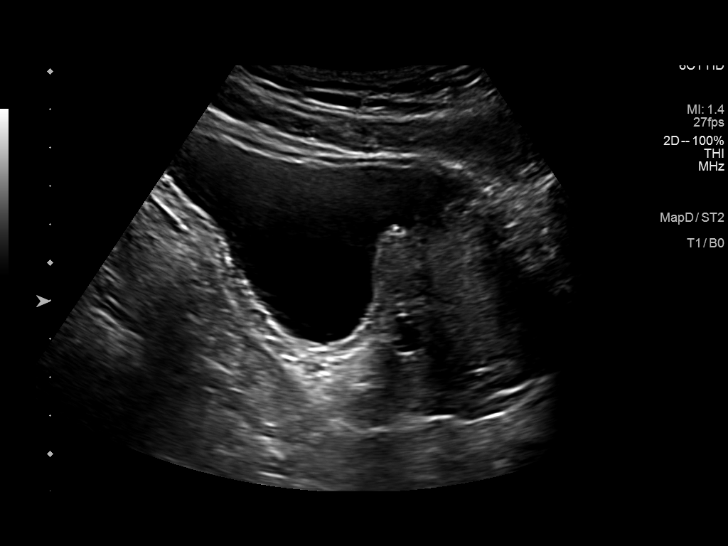
[im 38/46]
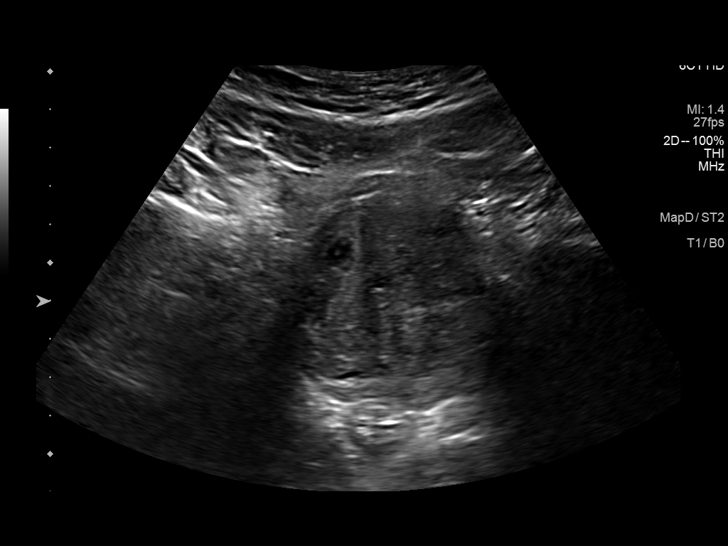
[im 42/46]
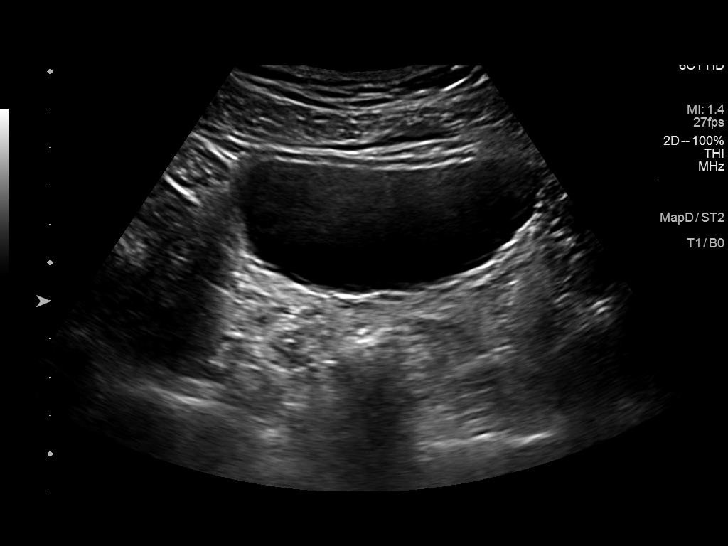
[im 46/46]
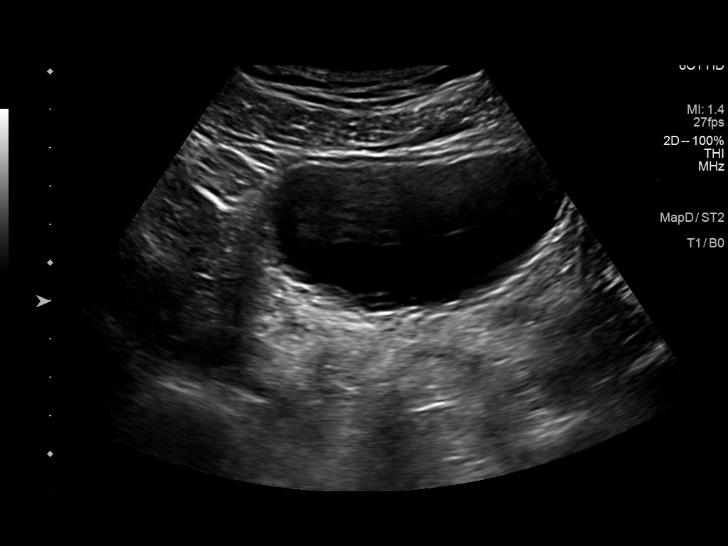

[14 of 25 positions shown; findings below may reference images not displayed]

FINDINGS: Right Kidney:

Renal measurements: 11.2 x 4.6 x 4.7 cm = volume: 128 mL.
Echogenicity within normal limits. No mass or hydronephrosis
visualized.

Left Kidney:

Renal measurements: 11.1 x 5.9 x 4.6 cm = volume: 152 mL.
Echogenicity within normal limits. No mass or hydronephrosis
visualized.

Bladder:

Appears normal for degree of bladder distention.

Other:

Prostate is enlarged with a transverse dimension measuring 5.7 cm.
IMPRESSION: 1. Enlarged prostate.
2. Otherwise normal renal ultrasound.

## 2022-02-22 ENCOUNTER — Encounter: Payer: Self-pay | Admitting: Internal Medicine

## 2022-02-25 ENCOUNTER — Other Ambulatory Visit: Payer: Self-pay

## 2022-02-25 ENCOUNTER — Encounter: Payer: Self-pay | Admitting: Internal Medicine

## 2022-02-25 ENCOUNTER — Ambulatory Visit (INDEPENDENT_AMBULATORY_CARE_PROVIDER_SITE_OTHER): Payer: 59 | Admitting: Internal Medicine

## 2022-02-25 VITALS — BP 128/80 | HR 52 | Temp 97.8°F | Ht 66.0 in | Wt 170.6 lb

## 2022-02-25 DIAGNOSIS — N320 Bladder-neck obstruction: Secondary | ICD-10-CM

## 2022-02-25 DIAGNOSIS — E538 Deficiency of other specified B group vitamins: Secondary | ICD-10-CM | POA: Diagnosis not present

## 2022-02-25 DIAGNOSIS — Z6830 Body mass index (BMI) 30.0-30.9, adult: Secondary | ICD-10-CM

## 2022-02-25 DIAGNOSIS — Z0000 Encounter for general adult medical examination without abnormal findings: Secondary | ICD-10-CM | POA: Diagnosis not present

## 2022-02-25 DIAGNOSIS — E6609 Other obesity due to excess calories: Secondary | ICD-10-CM | POA: Diagnosis not present

## 2022-02-25 LAB — CBC WITH DIFFERENTIAL/PLATELET
Basophils Absolute: 0 10*3/uL (ref 0.0–0.1)
Basophils Relative: 0.6 % (ref 0.0–3.0)
Eosinophils Absolute: 0.3 10*3/uL (ref 0.0–0.7)
Eosinophils Relative: 5.2 % — ABNORMAL HIGH (ref 0.0–5.0)
HCT: 46.2 % (ref 39.0–52.0)
Hemoglobin: 15.9 g/dL (ref 13.0–17.0)
Lymphocytes Relative: 33 % (ref 12.0–46.0)
Lymphs Abs: 2 10*3/uL (ref 0.7–4.0)
MCHC: 34.3 g/dL (ref 30.0–36.0)
MCV: 87.2 fl (ref 78.0–100.0)
Monocytes Absolute: 0.3 10*3/uL (ref 0.1–1.0)
Monocytes Relative: 5.2 % (ref 3.0–12.0)
Neutro Abs: 3.4 10*3/uL (ref 1.4–7.7)
Neutrophils Relative %: 56 % (ref 43.0–77.0)
Platelets: 204 10*3/uL (ref 150.0–400.0)
RBC: 5.3 Mil/uL (ref 4.22–5.81)
RDW: 13.7 % (ref 11.5–15.5)
WBC: 6.1 10*3/uL (ref 4.0–10.5)

## 2022-02-25 LAB — URINALYSIS
Bilirubin Urine: NEGATIVE
Hgb urine dipstick: NEGATIVE
Ketones, ur: NEGATIVE
Leukocytes,Ua: NEGATIVE
Nitrite: NEGATIVE
Specific Gravity, Urine: 1.025 (ref 1.000–1.030)
Total Protein, Urine: NEGATIVE
Urine Glucose: NEGATIVE
Urobilinogen, UA: 0.2 (ref 0.0–1.0)
pH: 5.5 (ref 5.0–8.0)

## 2022-02-25 LAB — VITAMIN B12: Vitamin B-12: 706 pg/mL (ref 211–911)

## 2022-02-25 LAB — LIPID PANEL
Cholesterol: 197 mg/dL (ref 0–200)
HDL: 48.6 mg/dL (ref >39.00)
LDL Cholesterol: 122 mg/dL — ABNORMAL HIGH (ref 0–99)
NonHDL: 148.31
Total CHOL/HDL Ratio: 4
Triglycerides: 133 mg/dL (ref 0.0–149.0)
VLDL: 26.6 mg/dL (ref 0.0–40.0)

## 2022-02-25 LAB — VITAMIN D 25 HYDROXY (VIT D DEFICIENCY, FRACTURES): VITD: 45.21 ng/mL (ref 30.00–100.00)

## 2022-02-25 LAB — COMPREHENSIVE METABOLIC PANEL
ALT: 16 U/L (ref 0–53)
AST: 20 U/L (ref 0–37)
Albumin: 4.6 g/dL (ref 3.5–5.2)
Alkaline Phosphatase: 80 U/L (ref 39–117)
BUN: 14 mg/dL (ref 6–23)
CO2: 31 mEq/L (ref 19–32)
Calcium: 9.7 mg/dL (ref 8.4–10.5)
Chloride: 103 mEq/L (ref 96–112)
Creatinine, Ser: 0.96 mg/dL (ref 0.40–1.50)
GFR: 88.91 mL/min (ref >60.00)
Glucose, Bld: 97 mg/dL (ref 70–99)
Potassium: 4.4 mEq/L (ref 3.5–5.1)
Sodium: 140 mEq/L (ref 135–145)
Total Bilirubin: 0.7 mg/dL (ref 0.2–1.2)
Total Protein: 7.4 g/dL (ref 6.0–8.3)

## 2022-02-25 LAB — PSA: PSA: 3.86 ng/mL (ref 0.10–4.00)

## 2022-02-25 LAB — TSH: TSH: 0.95 u[IU]/mL (ref 0.35–5.50)

## 2022-02-25 NOTE — Progress Notes (Signed)
? ?Subjective:  ?Patient ID: Andrew Salazar, male    DOB: 25-Aug-1966  Age: 56 y.o. MRN: [id number] ? ?CC: Annual Exam ? ? ?HPI ?Vandell Helton presents for  ? ?Outpatient Medications Prior to Visit  ?Medication Sig Dispense Refill  ? Cholecalciferol (VITAMIN D3) 50 MCG (2000 UT) capsule Take 1 capsule (2,000 Units total) by mouth daily. 100 capsule 3  ? Cyanocobalamin (VITAMIN B-12) 1000 MCG SUBL Place 1 tablet (1,000 mcg total) under the tongue daily. 90 tablet 3  ? ?No facility-administered medications prior to visit.  ? ? ?ROS: ?Review of Systems  ?Constitutional:  Negative for appetite change, fatigue and unexpected weight change.  ?HENT:  Negative for congestion, nosebleeds, sneezing, sore throat and trouble swallowing.   ?Eyes:  Negative for itching and visual disturbance.  ?Respiratory:  Negative for cough.   ?Cardiovascular:  Negative for chest pain, palpitations and leg swelling.  ?Gastrointestinal:  Negative for abdominal distention, blood in stool, diarrhea and nausea.  ?Genitourinary:  Positive for frequency. Negative for hematuria.  ?Musculoskeletal:  Negative for back pain, gait problem, joint swelling and neck pain.  ?Skin:  Negative for rash.  ?Neurological:  Negative for dizziness, tremors, speech difficulty and weakness.  ?Psychiatric/Behavioral:  Negative for agitation, dysphoric mood and sleep disturbance. The patient is not nervous/anxious.   ? ?Objective:  ?BP 128/80 (BP Location: Left Arm, Patient Position: Sitting, Cuff Size: Large)   Pulse (!) 52   Temp 97.8 ?F (36.6 ?C) (Oral)   Ht 5\' 6"  (1.676 m)   Wt 170 lb 9.6 oz (77.4 kg)   SpO2 99%   BMI 27.54 kg/m?  ? ?BP Readings from Last 3 Encounters:  ?02/25/22 128/80  ?08/08/21 132/80  ?09/27/20 132/80  ? ? ?Wt Readings from Last 3 Encounters:  ?02/25/22 170 lb 9.6 oz (77.4 kg)  ?08/08/21 176 lb (79.8 kg)  ?09/23/19 175 lb (79.4 kg)  ? ? ?Physical Exam ?Constitutional:   ?   General: He is not in acute distress. ?   Appearance: He is  well-developed. He is not diaphoretic.  ?HENT:  ?   Head: Normocephalic and atraumatic.  ?   Right Ear: External ear normal.  ?   Left Ear: External ear normal.  ?   Nose: Nose normal.  ?   Mouth/Throat:  ?   Pharynx: No oropharyngeal exudate.  ?Eyes:  ?   General: No scleral icterus.    ?   Right eye: No discharge.     ?   Left eye: No discharge.  ?   Conjunctiva/sclera: Conjunctivae normal.  ?   Pupils: Pupils are equal, round, and reactive to light.  ?Neck:  ?   Thyroid: No thyromegaly.  ?   Vascular: No JVD.  ?   Trachea: No tracheal deviation.  ?Cardiovascular:  ?   Rate and Rhythm: Normal rate and regular rhythm.  ?   Heart sounds: Normal heart sounds. No murmur heard. ?  No friction rub. No gallop.  ?Pulmonary:  ?   Effort: Pulmonary effort is normal. No respiratory distress.  ?   Breath sounds: Normal breath sounds. No stridor. No wheezing or rales.  ?Chest:  ?   Chest wall: No tenderness.  ?Abdominal:  ?   General: Bowel sounds are normal. There is no distension.  ?   Palpations: Abdomen is soft. There is no mass.  ?   Tenderness: There is no abdominal tenderness. There is no guarding or rebound.  ?Genitourinary: ?   Penis: Normal. No tenderness.   ?  Rectum: Normal. Guaiac result negative.  ?Musculoskeletal:     ?   General: No tenderness. Normal range of motion.  ?   Cervical back: Normal range of motion and neck supple.  ?Lymphadenopathy:  ?   Cervical: No cervical adenopathy.  ?Skin: ?   General: Skin is warm and dry.  ?   Coloration: Skin is not pale.  ?   Findings: No erythema or rash.  ?Neurological:  ?   Mental Status: He is alert and oriented to person, place, and time.  ?   Cranial Nerves: No cranial nerve deficit.  ?   Motor: No abnormal muscle tone.  ?   Coordination: Coordination normal.  ?   Deep Tendon Reflexes: Reflexes are normal and symmetric. Reflexes normal.  ?Psychiatric:     ?   Behavior: Behavior normal.     ?   Thought Content: Thought content normal.     ?   Judgment: Judgment  normal.  ?Prostate 1+ ? ?Lab Results  ?Component Value Date  ? WBC 6.6 09/27/2020  ? HGB 14.4 09/27/2020  ? HCT 42.1 09/27/2020  ? PLT 207.0 09/27/2020  ? GLUCOSE 97 09/27/2020  ? CHOL 207 (H) 09/27/2020  ? TRIG 163.0 (H) 09/27/2020  ? HDL 42.70 09/27/2020  ? LDLDIRECT 144.2 11/06/2012  ? LDLCALC 132 (H) 09/27/2020  ? ALT 20 09/27/2020  ? AST 20 09/27/2020  ? NA 138 09/27/2020  ? K 3.9 09/27/2020  ? CL 104 09/27/2020  ? CREATININE 0.96 09/27/2020  ? BUN 19 09/27/2020  ? CO2 28 09/27/2020  ? TSH 2.01 09/27/2020  ? PSA 3.31 09/27/2020  ? ? ?US Renal ? ?Result Date: 10/08/2020 ?CLINICAL DATA:  Dysuria and prosthetic hyperplasia EXAM: RENAL / URINARY TRACT ULTRASOUND COMPLETE COMPARISON:  None. FINDINGS: Right Kidney: Renal measurements: 11.2 x 4.6 x 4.7 cm = volume: 128 mL. Echogenicity within normal limits. No mass or hydronephrosis visualized. Left Kidney: Renal measurements: 11.1 x 5.9 x 4.6 cm = volume: 152 mL. Echogenicity within normal limits. No mass or hydronephrosis visualized. Bladder: Appears normal for degree of bladder distention. Other: Prostate is enlarged with a transverse dimension measuring 5.7 cm. IMPRESSION: 1. Enlarged prostate. 2. Otherwise normal renal ultrasound. Electronically Signed   By: Ulyses Jarred M.D.   On: 10/08/2020 20:00  ? ? ?Assessment & Plan:  ? ?Problem List Items Addressed This Visit   ? ? B12 deficiency  ?  On B12  ?  ?  ? Relevant Orders  ? Vitamin B12  ? VITAMIN D 25 Hydroxy (Vit-D Deficiency, Fractures)  ? Bladder neck obstruction  ?  Saw Palmetto trial ?Check PSA ?Urol ref was offered ?  ?  ? Relevant Orders  ? PSA  ? VITAMIN D 25 Hydroxy (Vit-D Deficiency, Fractures)  ? Obesity  ?  Wt Readings from Last 3 Encounters:  ?02/25/22 170 lb 9.6 oz (77.4 kg)  ?08/08/21 176 lb (79.8 kg)  ?09/23/19 175 lb (79.4 kg)  ?Better ? ? ?  ?  ? Well adult exam - Primary  ?  We discussed age appropriate health related issues, including available/recomended screening tests and vaccinations. We  discussed a need for adhering to healthy diet and exercise. Labs were ordered to be later reviewed . All questions were answered. ?cologuard is due ?A cardiac CT scan for calcium scoring offered 9/20 ?  ?  ? Relevant Orders  ? TSH  ? Urinalysis  ? CBC with Differential/Platelet  ? Lipid panel  ? PSA  ?  Comprehensive metabolic panel  ? Vitamin B12  ? VITAMIN D 25 Hydroxy (Vit-D Deficiency, Fractures)  ?  ? ? ?No orders of the defined types were placed in this encounter. ?  ? ? ?Follow-up: Return in about 1 year (around 02/26/2023) for Wellness Exam. ? ?Walker Kehr, MD ?

## 2022-02-25 NOTE — Patient Instructions (Signed)
Try Saw Palmetto for prostate ?

## 2022-02-25 NOTE — Assessment & Plan Note (Addendum)
Wt Readings from Last 3 Encounters:  ?02/25/22 170 lb 9.6 oz (77.4 kg)  ?08/08/21 176 lb (79.8 kg)  ?09/23/19 175 lb (79.4 kg)  ?Better ? ? ?

## 2022-02-25 NOTE — Assessment & Plan Note (Addendum)
Saw Palmetto trial ?Check PSA ?Urol ref was offered ?

## 2022-02-25 NOTE — Assessment & Plan Note (Addendum)
We discussed age appropriate health related issues, including available/recomended screening tests and vaccinations. We discussed a need for adhering to healthy diet and exercise. Labs were ordered to be later reviewed . All questions were answered. ?cologuard is due ?A cardiac CT scan for calcium scoring offered 9/20 ?

## 2022-02-25 NOTE — Assessment & Plan Note (Signed)
On B12 

## 2022-03-27 LAB — COLOGUARD: COLOGUARD: NEGATIVE

## 2023-02-03 ENCOUNTER — Ambulatory Visit (INDEPENDENT_AMBULATORY_CARE_PROVIDER_SITE_OTHER): Payer: 59 | Admitting: Internal Medicine

## 2023-02-03 ENCOUNTER — Encounter: Payer: Self-pay | Admitting: Internal Medicine

## 2023-02-03 VITALS — BP 130/82 | HR 55 | Temp 98.1°F | Ht 66.0 in | Wt 182.0 lb

## 2023-02-03 DIAGNOSIS — Z125 Encounter for screening for malignant neoplasm of prostate: Secondary | ICD-10-CM

## 2023-02-03 DIAGNOSIS — N320 Bladder-neck obstruction: Secondary | ICD-10-CM | POA: Diagnosis not present

## 2023-02-03 DIAGNOSIS — E538 Deficiency of other specified B group vitamins: Secondary | ICD-10-CM | POA: Diagnosis not present

## 2023-02-03 DIAGNOSIS — E559 Vitamin D deficiency, unspecified: Secondary | ICD-10-CM

## 2023-02-03 DIAGNOSIS — Z0000 Encounter for general adult medical examination without abnormal findings: Secondary | ICD-10-CM | POA: Diagnosis not present

## 2023-02-03 LAB — COMPREHENSIVE METABOLIC PANEL
ALT: 18 U/L (ref 0–53)
AST: 17 U/L (ref 0–37)
Albumin: 4.1 g/dL (ref 3.5–5.2)
Alkaline Phosphatase: 79 U/L (ref 39–117)
BUN: 19 mg/dL (ref 6–23)
CO2: 27 mEq/L (ref 19–32)
Calcium: 9.4 mg/dL (ref 8.4–10.5)
Chloride: 106 mEq/L (ref 96–112)
Creatinine, Ser: 0.94 mg/dL (ref 0.40–1.50)
GFR: 90.59 mL/min (ref >60.00)
Glucose, Bld: 100 mg/dL — ABNORMAL HIGH (ref 70–99)
Potassium: 4.2 mEq/L (ref 3.5–5.1)
Sodium: 141 mEq/L (ref 135–145)
Total Bilirubin: 0.6 mg/dL (ref 0.2–1.2)
Total Protein: 6.7 g/dL (ref 6.0–8.3)

## 2023-02-03 LAB — URINALYSIS
Bilirubin Urine: NEGATIVE
Ketones, ur: NEGATIVE
Leukocytes,Ua: NEGATIVE
Nitrite: NEGATIVE
Specific Gravity, Urine: >=1.03 — AB (ref 1.000–1.030)
Total Protein, Urine: NEGATIVE
Urine Glucose: 100 — AB
Urobilinogen, UA: 0.2 (ref 0.0–1.0)
pH: 6 (ref 5.0–8.0)

## 2023-02-03 LAB — PSA: PSA: 5.19 ng/mL — ABNORMAL HIGH (ref 0.10–4.00)

## 2023-02-03 LAB — CBC WITH DIFFERENTIAL/PLATELET
Basophils Absolute: 0 10*3/uL (ref 0.0–0.1)
Basophils Relative: 0.8 % (ref 0.0–3.0)
Eosinophils Absolute: 0.4 10*3/uL (ref 0.0–0.7)
Eosinophils Relative: 6.4 % — ABNORMAL HIGH (ref 0.0–5.0)
HCT: 43.9 % (ref 39.0–52.0)
Hemoglobin: 14.9 g/dL (ref 13.0–17.0)
Lymphocytes Relative: 37.8 % (ref 12.0–46.0)
Lymphs Abs: 2.3 10*3/uL (ref 0.7–4.0)
MCHC: 33.9 g/dL (ref 30.0–36.0)
MCV: 86.5 fl (ref 78.0–100.0)
Monocytes Absolute: 0.3 10*3/uL (ref 0.1–1.0)
Monocytes Relative: 5.4 % (ref 3.0–12.0)
Neutro Abs: 3.1 10*3/uL (ref 1.4–7.7)
Neutrophils Relative %: 49.6 % (ref 43.0–77.0)
Platelets: 216 10*3/uL (ref 150.0–400.0)
RBC: 5.08 Mil/uL (ref 4.22–5.81)
RDW: 13.7 % (ref 11.5–15.5)
WBC: 6.2 10*3/uL (ref 4.0–10.5)

## 2023-02-03 LAB — LIPID PANEL
Cholesterol: 208 mg/dL — ABNORMAL HIGH (ref 0–200)
HDL: 45.3 mg/dL (ref >39.00)
LDL Cholesterol: 142 mg/dL — ABNORMAL HIGH (ref 0–99)
NonHDL: 162.33
Total CHOL/HDL Ratio: 5
Triglycerides: 101 mg/dL (ref 0.0–149.0)
VLDL: 20.2 mg/dL (ref 0.0–40.0)

## 2023-02-03 LAB — VITAMIN B12: Vitamin B-12: 630 pg/mL (ref 211–911)

## 2023-02-03 LAB — VITAMIN D 25 HYDROXY (VIT D DEFICIENCY, FRACTURES): VITD: 32.65 ng/mL (ref 30.00–100.00)

## 2023-02-03 LAB — TSH: TSH: 0.95 u[IU]/mL (ref 0.35–5.50)

## 2023-02-03 NOTE — Progress Notes (Signed)
Subjective:  Patient ID: Andrew Salazar, male    DOB: Oct 23, 1966  Age: 57 y.o. MRN: [id number]  CC: Annual Exam   HPI Andrew Salazar presents for a well exam  Outpatient Medications Prior to Visit  Medication Sig Dispense Refill   Cholecalciferol (VITAMIN D3) 50 MCG (2000 UT) capsule Take 1 capsule (2,000 Units total) by mouth daily. 100 capsule 3   Cyanocobalamin (VITAMIN B-12) 1000 MCG SUBL Place 1 tablet (1,000 mcg total) under the tongue daily. 90 tablet 3   No facility-administered medications prior to visit.    ROS: Review of Systems  Constitutional:  Positive for unexpected weight change. Negative for appetite change and fatigue.  HENT:  Negative for congestion, nosebleeds, sneezing, sore throat and trouble swallowing.   Eyes:  Negative for itching and visual disturbance.  Respiratory:  Negative for cough.   Cardiovascular:  Negative for chest pain, palpitations and leg swelling.  Gastrointestinal:  Negative for abdominal distention, blood in stool, diarrhea and nausea.  Genitourinary:  Positive for frequency. Negative for hematuria.  Musculoskeletal:  Negative for back pain, gait problem, joint swelling and neck pain.  Skin:  Negative for rash.  Neurological:  Negative for dizziness, tremors, speech difficulty and weakness.  Psychiatric/Behavioral:  Negative for agitation, dysphoric mood and sleep disturbance. The patient is not nervous/anxious.     Objective:  BP 130/82 (BP Location: Right Arm, Patient Position: Sitting, Cuff Size: Normal)   Pulse (!) 55   Temp 98.1 F (36.7 C) (Oral)   Ht 5' 6"$  (1.676 m)   Wt 182 lb (82.6 kg)   SpO2 97%   BMI 29.38 kg/m   BP Readings from Last 3 Encounters:  02/03/23 130/82  02/25/22 128/80  08/08/21 132/80    Wt Readings from Last 3 Encounters:  02/03/23 182 lb (82.6 kg)  02/25/22 170 lb 9.6 oz (77.4 kg)  08/08/21 176 lb (79.8 kg)    Physical Exam Constitutional:      General: He is not in acute distress.     Appearance: Normal appearance. He is well-developed. He is obese.     Comments: NAD  Eyes:     Conjunctiva/sclera: Conjunctivae normal.     Pupils: Pupils are equal, round, and reactive to light.  Neck:     Thyroid: No thyromegaly.     Vascular: No JVD.  Cardiovascular:     Rate and Rhythm: Normal rate and regular rhythm.     Heart sounds: Normal heart sounds. No murmur heard.    No friction rub. No gallop.  Pulmonary:     Effort: Pulmonary effort is normal. No respiratory distress.     Breath sounds: Normal breath sounds. No wheezing or rales.  Chest:     Chest wall: No tenderness.  Abdominal:     General: Bowel sounds are normal. There is no distension.     Palpations: Abdomen is soft. There is no mass.     Tenderness: There is no abdominal tenderness. There is no guarding or rebound.  Genitourinary:    Rectum: Normal. Guaiac result negative.  Musculoskeletal:        General: No tenderness. Normal range of motion.     Cervical back: Normal range of motion.  Lymphadenopathy:     Cervical: No cervical adenopathy.  Skin:    General: Skin is warm and dry.     Findings: No rash.  Neurological:     Mental Status: He is alert and oriented to person, place, and time.  Cranial Nerves: No cranial nerve deficit.     Motor: No abnormal muscle tone.     Coordination: Coordination normal.     Gait: Gait normal.     Deep Tendon Reflexes: Reflexes are normal and symmetric.  Psychiatric:        Behavior: Behavior normal.        Thought Content: Thought content normal.        Judgment: Judgment normal.    Prostate 1+   Lab Results  Component Value Date   WBC 6.1 02/25/2022   HGB 15.9 02/25/2022   HCT 46.2 02/25/2022   PLT 204.0 02/25/2022   GLUCOSE 97 02/25/2022   CHOL 197 02/25/2022   TRIG 133.0 02/25/2022   HDL 48.60 02/25/2022   LDLDIRECT 144.2 11/06/2012   LDLCALC 122 (H) 02/25/2022   ALT 16 02/25/2022   AST 20 02/25/2022   NA 140 02/25/2022   K 4.4 02/25/2022    CL 103 02/25/2022   CREATININE 0.96 02/25/2022   BUN 14 02/25/2022   CO2 31 02/25/2022   TSH 0.95 02/25/2022   PSA 3.86 02/25/2022    US Renal  Result Date: 10/08/2020 CLINICAL DATA:  Dysuria and prosthetic hyperplasia EXAM: RENAL / URINARY TRACT ULTRASOUND COMPLETE COMPARISON:  None. FINDINGS: Right Kidney: Renal measurements: 11.2 x 4.6 x 4.7 cm = volume: 128 mL. Echogenicity within normal limits. No mass or hydronephrosis visualized. Left Kidney: Renal measurements: 11.1 x 5.9 x 4.6 cm = volume: 152 mL. Echogenicity within normal limits. No mass or hydronephrosis visualized. Bladder: Appears normal for degree of bladder distention. Other: Prostate is enlarged with a transverse dimension measuring 5.7 cm. IMPRESSION: 1. Enlarged prostate. 2. Otherwise normal renal ultrasound. Electronically Signed   By: Ulyses Jarred M.D.   On: 10/08/2020 20:00    Assessment & Plan:   Problem List Items Addressed This Visit       Genitourinary   Bladder neck obstruction    Using herbal drops        Other   Well adult exam - Primary    We discussed age appropriate health related issues, including available/recomended screening tests and vaccinations. We discussed a need for adhering to healthy diet and exercise. Labs were ordered to be later reviewed . All questions were answered. cologuard 2017, 2023 (-) A cardiac CT scan for calcium scoring offered 9/20      Relevant Orders   TSH   Urinalysis   CBC with Differential/Platelet   Lipid panel   PSA   Comprehensive metabolic panel   Vitamin [id number]   VITAMIN D 25 Hydroxy (Vit-D Deficiency, Fractures)   Vitamin D deficiency    On Vit D      Relevant Orders   VITAMIN D 25 Hydroxy (Vit-D Deficiency, Fractures)   B12 deficiency    On B12      Relevant Orders   Vitamin B12      No orders of the defined types were placed in this encounter.     Follow-up: No follow-ups on file.  Walker Kehr, MD

## 2023-02-03 NOTE — Assessment & Plan Note (Signed)
On Vit D 

## 2023-02-03 NOTE — Assessment & Plan Note (Signed)
Using herbal drops

## 2023-02-03 NOTE — Assessment & Plan Note (Signed)
We discussed age appropriate health related issues, including available/recomended screening tests and vaccinations. We discussed a need for adhering to healthy diet and exercise. Labs were ordered to be later reviewed . All questions were answered. cologuard 2017, 2023 (-) A cardiac CT scan for calcium scoring offered 9/20

## 2023-02-03 NOTE — Assessment & Plan Note (Signed)
On B12 

## 2023-02-28 ENCOUNTER — Other Ambulatory Visit: Payer: Self-pay | Admitting: Internal Medicine

## 2023-02-28 DIAGNOSIS — R972 Elevated prostate specific antigen [PSA]: Secondary | ICD-10-CM

## 2023-09-29 ENCOUNTER — Encounter (HOSPITAL_BASED_OUTPATIENT_CLINIC_OR_DEPARTMENT_OTHER): Payer: Self-pay | Admitting: Emergency Medicine

## 2023-09-29 ENCOUNTER — Other Ambulatory Visit: Payer: Self-pay

## 2023-09-29 ENCOUNTER — Emergency Department (HOSPITAL_BASED_OUTPATIENT_CLINIC_OR_DEPARTMENT_OTHER)
Admission: EM | Admit: 2023-09-29 | Discharge: 2023-09-29 | Disposition: A | Discharge status: Home / Self Care | Payer: 59 | Attending: Emergency Medicine | Admitting: Emergency Medicine

## 2023-09-29 ENCOUNTER — Emergency Department (HOSPITAL_BASED_OUTPATIENT_CLINIC_OR_DEPARTMENT_OTHER): Payer: 59

## 2023-09-29 DIAGNOSIS — M25511 Pain in right shoulder: Secondary | ICD-10-CM | POA: Diagnosis present

## 2023-09-29 DIAGNOSIS — W19XXXA Unspecified fall, initial encounter: Secondary | ICD-10-CM | POA: Diagnosis not present

## 2023-09-29 MED ORDER — HYDROCODONE-ACETAMINOPHEN 5-325 MG PO TABS
1.0000 | ORAL_TABLET | Freq: Four times a day (QID) | ORAL | 0 refills | Status: AC | PRN
Start: 2023-09-29 — End: ?

## 2023-09-29 MED ORDER — KETOROLAC TROMETHAMINE 60 MG/2ML IM SOLN
30.0000 mg | Freq: Once | INTRAMUSCULAR | Status: AC
  Administered 2023-09-29: 30 mg via INTRAMUSCULAR
  Filled 2023-09-29: qty 2

## 2023-09-29 NOTE — ED Provider Notes (Signed)
[place] [facility] AT [facility] Provider Note   CSN: [id number] Arrival date & time: 09/29/23  1451     History  Chief Complaint  Patient presents with   Shoulder Injury    Andrew Salazar is a 57 y.o. male.   Shoulder Injury     57 year old male presenting to the emergency department with right-sided shoulder pain.  The patient states that he sustained a mechanical fall last night landing onto his right shoulder.  He has had pain with range of motion ever since.  He denies any deformity.  He denies any head trauma or loss of consciousness.  He arrives GCS 15, ABC intact.  Home Medications Prior to Admission medications   Medication Sig Start Date End Date Taking? Authorizing Provider  HYDROcodone-acetaminophen (NORCO/VICODIN) 5-325 MG tablet Take 1 tablet by mouth every 6 (six) hours as needed. 09/29/23  Yes Ernie Avena, MD  Cholecalciferol (VITAMIN D3) 50 MCG (2000 UT) capsule Take 1 capsule (2,000 Units total) by mouth daily. 09/23/19   Plotnikov, Georgina Quint, MD  Cyanocobalamin (VITAMIN B-12) 1000 MCG SUBL Place 1 tablet (1,000 mcg total) under the tongue daily. 09/23/19   Plotnikov, Georgina Quint, MD      Allergies    Patient has no known allergies.    Review of Systems   Review of Systems  Musculoskeletal:  Positive for arthralgias.  All other systems reviewed and are negative.   Physical Exam Updated Vital Signs BP (!) 179/95   Pulse (!) 58   Temp 98 F (36.7 C)   Resp 18   Ht 5' 1.42" (1.56 m)   Wt 77.1 kg   SpO2 97%   BMI 31.69 kg/m  Physical Exam Vitals and nursing note reviewed.  Constitutional:      General: He is not in acute distress. HENT:     Head: Normocephalic and atraumatic.  Eyes:     Conjunctiva/sclera: Conjunctivae normal.     Pupils: Pupils are equal, round, and reactive to light.  Cardiovascular:     Rate and Rhythm: Normal rate and regular rhythm.  Pulmonary:     Effort: Pulmonary effort is normal. No  respiratory distress.  Abdominal:     General: There is no distension.     Tenderness: There is no guarding.  Musculoskeletal:        General: No deformity or signs of injury.     Cervical back: Neck supple.     Comments: Passive range of motion of the right shoulder intact with no obvious deformity, mild tenderness about the lateral aspect of the shoulder, pain with attempts of engagement of the rotator cuff, distal right upper extremity neurovascularly intact  Skin:    Findings: No lesion or rash.  Neurological:     General: No focal deficit present.     Mental Status: He is alert. Mental status is at baseline.     ED Results / Procedures / Treatments   Labs (all labs ordered are listed, but only abnormal results are displayed) Labs Reviewed - No data to display  EKG None  Radiology DG Shoulder Right  Result Date: 09/29/2023 CLINICAL DATA:  Right shoulder pain after a fall on Saturday. Decreased range of motion. EXAM: RIGHT SHOULDER - 2+ VIEW COMPARISON:  09/28/2008. FINDINGS: No acute osseous or joint abnormality. Degenerative changes in the right acromioclavicular joint. Visualized right chest is grossly unremarkable. IMPRESSION: 1. No acute findings. 2. Right acromioclavicular joint osteoarthritis. Electronically Signed   By: Leanna Battles  M.D.   On: 09/29/2023 16:38    Procedures Procedures    Medications Ordered in ED Medications  ketorolac (TORADOL) injection 30 mg (30 mg Intramuscular Given 09/29/23 1747)    ED Course/ Medical Decision Making/ A&P                                 Medical Decision Making Amount and/or Complexity of Data Reviewed Radiology: ordered.  Risk Prescription drug management.     57 year old male presenting to the emergency department with right-sided shoulder pain.  The patient states that he sustained a mechanical fall last night landing onto his right shoulder.  He has had pain with range of motion ever since.  He denies any  deformity.  He denies any head trauma or loss of consciousness.  He arrives GCS 15, ABC intact.  On arrival, the patient was vitally stable, x-ray imaging was performed of the right shoulder with no acute findings, right AC joint osteoarthritis.  On exam, the patient has no tenderness about the [facility] joint, low concern for [facility] separation however I do have concern for rotator cuff tear in the setting of the patient's recent fall.  No evidence of fracture or dislocation noted and the patient is neurovascularly intact on exam.  Toradol was administered for pain control and the patient was placed in a sling.  The patient was advised rest, ice, NSAIDs for pain control and follow-up outpatient with orthopedics for repeat assessment.   Final Clinical Impression(s) / ED Diagnoses Final diagnoses:  Fall, initial encounter  Acute pain of right shoulder    Rx / DC Orders ED Discharge Orders          Ordered    AMB referral to orthopedics        09/29/23 1800    HYDROcodone-acetaminophen (NORCO/VICODIN) 5-325 MG tablet  Every 6 hours PRN        09/29/23 1801              Ernie Avena, MD 09/29/23 1801

## 2023-09-29 NOTE — Discharge Instructions (Addendum)
Your x-ray imaging was negative for fracture or dislocation of the right shoulder.  Your symptoms could be due to a torn rotator cuff.  We have placed you in a sling for comfort.  Recommend NSAIDs for pain control, a short course of opiates have been provided for breakthrough pain, ice the shoulder for the next 24 hours.

## 2023-09-29 NOTE — ED Triage Notes (Signed)
Pt reports rt shoulder pain from a mechanical fall Sat night, limited ROM, no obvious deformity or edema

## 2023-11-25 ENCOUNTER — Ambulatory Visit: Payer: 59 | Admitting: Internal Medicine

## 2023-11-25 ENCOUNTER — Encounter: Payer: Self-pay | Admitting: Internal Medicine

## 2023-11-25 VITALS — BP 138/84 | HR 55 | Temp 98.1°F | Ht 61.0 in | Wt 179.0 lb

## 2023-11-25 DIAGNOSIS — I10 Essential (primary) hypertension: Secondary | ICD-10-CM | POA: Insufficient documentation

## 2023-11-25 DIAGNOSIS — E538 Deficiency of other specified B group vitamins: Secondary | ICD-10-CM | POA: Diagnosis not present

## 2023-11-25 DIAGNOSIS — R972 Elevated prostate specific antigen [PSA]: Secondary | ICD-10-CM

## 2023-11-25 DIAGNOSIS — S46011D Strain of muscle(s) and tendon(s) of the rotator cuff of right shoulder, subsequent encounter: Secondary | ICD-10-CM | POA: Diagnosis not present

## 2023-11-25 DIAGNOSIS — M75100 Unspecified rotator cuff tear or rupture of unspecified shoulder, not specified as traumatic: Secondary | ICD-10-CM | POA: Insufficient documentation

## 2023-11-25 DIAGNOSIS — R030 Elevated blood-pressure reading, without diagnosis of hypertension: Secondary | ICD-10-CM

## 2023-11-25 MED ORDER — LOSARTAN POTASSIUM 100 MG PO TABS: 100.0000 mg | ORAL_TABLET | Freq: Every day | ORAL | 3 refills | Status: DC

## 2023-11-25 NOTE — Progress Notes (Signed)
Probable?  Subjective:  Patient ID: Andrew Salazar, male    DOB: 01-20-1966  Age: 57 y.o. MRN: [id number]  CC: Annual Exam (And BP concerns )   HPI Andrew Salazar presents for elevated sBP 160 at home C/o R shoulder pain - pt fell at work - MRI w/rotator cuff tear R, seeing Ortho F/u elevated PSA  Outpatient Medications Prior to Visit  Medication Sig Dispense Refill   Cholecalciferol (VITAMIN D3) 50 MCG (2000 UT) capsule Take 1 capsule (2,000 Units total) by mouth daily. 100 capsule 3   Cyanocobalamin (VITAMIN B-12) 1000 MCG SUBL Place 1 tablet (1,000 mcg total) under the tongue daily. 90 tablet 3   HYDROcodone-acetaminophen (NORCO/VICODIN) 5-325 MG tablet Take 1 tablet by mouth every 6 (six) hours as needed. 10 tablet 0   No facility-administered medications prior to visit.    ROS: Review of Systems  Constitutional:  Negative for appetite change, fatigue and unexpected weight change.  HENT:  Negative for congestion, nosebleeds, sneezing, sore throat and trouble swallowing.   Eyes:  Negative for itching and visual disturbance.  Respiratory:  Negative for cough.   Cardiovascular:  Negative for chest pain, palpitations and leg swelling.  Gastrointestinal:  Negative for abdominal distention, blood in stool, diarrhea and nausea.  Genitourinary:  Negative for frequency and hematuria.  Musculoskeletal:  Positive for arthralgias. Negative for back pain, gait problem, joint swelling and neck pain.  Skin:  Negative for rash.  Neurological:  Negative for dizziness, tremors, speech difficulty and weakness.  Psychiatric/Behavioral:  Negative for agitation, dysphoric mood and sleep disturbance. The patient is not nervous/anxious.     Objective:  BP 138/84 (BP Location: Left Arm, Patient Position: Sitting, Cuff Size: Normal)   Pulse (!) 55   Temp 98.1 F (36.7 C) (Oral)   Ht 5\' 1"  (1.549 m)   Wt 179 lb (81.2 kg)   SpO2 98%   BMI 33.82 kg/m   BP Readings from Last 3 Encounters:   11/25/23 138/84  09/29/23 (!) 179/95  02/03/23 130/82    Wt Readings from Last 3 Encounters:  11/25/23 179 lb (81.2 kg)  09/29/23 170 lb (77.1 kg)  02/03/23 182 lb (82.6 kg)    Physical Exam Constitutional:      General: He is not in acute distress.    Appearance: Normal appearance. He is well-developed.     Comments: NAD  Eyes:     Conjunctiva/sclera: Conjunctivae normal.     Pupils: Pupils are equal, round, and reactive to light.  Neck:     Thyroid: No thyromegaly.     Vascular: No JVD.  Cardiovascular:     Rate and Rhythm: Normal rate and regular rhythm.     Heart sounds: Normal heart sounds. No murmur heard.    No friction rub. No gallop.  Pulmonary:     Effort: Pulmonary effort is normal. No respiratory distress.     Breath sounds: Normal breath sounds. No wheezing or rales.  Chest:     Chest wall: No tenderness.  Abdominal:     General: Bowel sounds are normal. There is no distension.     Palpations: Abdomen is soft. There is no mass.     Tenderness: There is no abdominal tenderness. There is left CVA tenderness. There is no guarding or rebound.  Musculoskeletal:        General: No tenderness. Normal range of motion.     Cervical back: Normal range of motion.  Lymphadenopathy:     Cervical: No cervical adenopathy.  Skin:    General: Skin is warm and dry.     Findings: No rash.  Neurological:     Mental Status: He is alert and oriented to person, place, and time.     Cranial Nerves: No cranial nerve deficit.     Motor: No abnormal muscle tone.     Coordination: Coordination normal.     Gait: Gait normal.     Deep Tendon Reflexes: Reflexes are normal and symmetric.  Psychiatric:        Behavior: Behavior normal.        Thought Content: Thought content normal.        Judgment: Judgment normal.   R shoulder w/pain  Lab Results  Component Value Date   WBC 6.2 02/03/2023   HGB 14.9 02/03/2023   HCT 43.9 02/03/2023   PLT 216.0 02/03/2023   GLUCOSE 100  (H) 02/03/2023   CHOL 208 (H) 02/03/2023   TRIG 101.0 02/03/2023   HDL 45.30 02/03/2023   LDLDIRECT 144.2 11/06/2012   LDLCALC 142 (H) 02/03/2023   ALT 18 02/03/2023   AST 17 02/03/2023   NA 141 02/03/2023   K 4.2 02/03/2023   CL 106 02/03/2023   CREATININE 0.94 02/03/2023   BUN 19 02/03/2023   CO2 27 02/03/2023   TSH 0.95 02/03/2023   PSA 5.19 (H) 02/03/2023    DG Shoulder Right  Result Date: 09/29/2023 CLINICAL DATA:  Right shoulder pain after a fall on Saturday. Decreased range of motion. EXAM: RIGHT SHOULDER - 2+ VIEW COMPARISON:  09/28/2008. FINDINGS: No acute osseous or joint abnormality. Degenerative changes in the right acromioclavicular joint. Visualized right chest is grossly unremarkable. IMPRESSION: 1. No acute findings. 2. Right acromioclavicular joint osteoarthritis. Electronically Signed   By: Leanna Battles M.D.   On: 09/29/2023 16:38    Assessment & Plan:   Problem List Items Addressed This Visit     B12 deficiency   On B12      Elevated BP without diagnosis of hypertension - Primary   NAS Losrtan if elevated BP      Rotator cuff tear   R shoulder F/u w/Ortho ROM exercises      Other Visit Diagnoses       Elevated PSA             Meds ordered this encounter  Medications   losartan (COZAAR) 100 MG tablet    Sig: Take 1 tablet (100 mg total) by mouth daily.    Dispense:  90 tablet    Refill:  3      Follow-up: Return in about 3 months (around 02/23/2024) for Wellness Exam.  Sonda Primes, MD

## 2023-11-25 NOTE — Assessment & Plan Note (Signed)
R shoulder F/u w/Ortho ROM exercises

## 2023-11-25 NOTE — Assessment & Plan Note (Signed)
NAS Losrtan if elevated BP

## 2023-11-25 NOTE — Assessment & Plan Note (Signed)
On B12 

## 2023-11-25 NOTE — Patient Instructions (Signed)
Start Losartan 100 mg daily if BP>150

## 2023-11-27 LAB — PSA, TOTAL AND FREE
PSA, % Free: 28 % (ref >25)
PSA, Free: 1.6 ng/mL
PSA, Total: 5.7 ng/mL — ABNORMAL HIGH (ref <=4.0)

## 2023-11-28 ENCOUNTER — Encounter: Payer: Self-pay | Admitting: Internal Medicine

## 2023-11-28 DIAGNOSIS — R972 Elevated prostate specific antigen [PSA]: Secondary | ICD-10-CM | POA: Insufficient documentation

## 2024-01-08 DIAGNOSIS — M75120 Complete rotator cuff tear or rupture of unspecified shoulder, not specified as traumatic: Secondary | ICD-10-CM | POA: Insufficient documentation

## 2024-02-20 ENCOUNTER — Ambulatory Visit (INDEPENDENT_AMBULATORY_CARE_PROVIDER_SITE_OTHER): Payer: Commercial Managed Care - PPO | Admitting: Internal Medicine

## 2024-02-20 ENCOUNTER — Encounter: Payer: Self-pay | Admitting: Internal Medicine

## 2024-02-20 VITALS — BP 140/90 | HR 78 | Temp 98.6°F | Ht 61.0 in | Wt 175.0 lb

## 2024-02-20 DIAGNOSIS — E538 Deficiency of other specified B group vitamins: Secondary | ICD-10-CM | POA: Diagnosis not present

## 2024-02-20 DIAGNOSIS — Z125 Encounter for screening for malignant neoplasm of prostate: Secondary | ICD-10-CM

## 2024-02-20 DIAGNOSIS — I10 Essential (primary) hypertension: Secondary | ICD-10-CM

## 2024-02-20 DIAGNOSIS — S46011D Strain of muscle(s) and tendon(s) of the rotator cuff of right shoulder, subsequent encounter: Secondary | ICD-10-CM

## 2024-02-20 DIAGNOSIS — E559 Vitamin D deficiency, unspecified: Secondary | ICD-10-CM

## 2024-02-20 DIAGNOSIS — Z0000 Encounter for general adult medical examination without abnormal findings: Secondary | ICD-10-CM | POA: Diagnosis not present

## 2024-02-20 LAB — CBC WITH DIFFERENTIAL/PLATELET
Basophils Absolute: 0 10*3/uL (ref 0.0–0.1)
Basophils Relative: 0.8 % (ref 0.0–3.0)
Eosinophils Absolute: 0.3 10*3/uL (ref 0.0–0.7)
Eosinophils Relative: 6.1 % — ABNORMAL HIGH (ref 0.0–5.0)
HCT: 44.4 % (ref 39.0–52.0)
Hemoglobin: 15 g/dL (ref 13.0–17.0)
Lymphocytes Relative: 36.3 % (ref 12.0–46.0)
Lymphs Abs: 1.9 10*3/uL (ref 0.7–4.0)
MCHC: 33.9 g/dL (ref 30.0–36.0)
MCV: 87.3 fl (ref 78.0–100.0)
Monocytes Absolute: 0.3 10*3/uL (ref 0.1–1.0)
Monocytes Relative: 5 % (ref 3.0–12.0)
Neutro Abs: 2.8 10*3/uL (ref 1.4–7.7)
Neutrophils Relative %: 51.8 % (ref 43.0–77.0)
Platelets: 225 10*3/uL (ref 150.0–400.0)
RBC: 5.09 Mil/uL (ref 4.22–5.81)
RDW: 13.7 % (ref 11.5–15.5)
WBC: 5.4 10*3/uL (ref 4.0–10.5)

## 2024-02-20 LAB — COMPREHENSIVE METABOLIC PANEL
ALT: 17 U/L (ref 0–53)
AST: 21 U/L (ref 0–37)
Albumin: 4.6 g/dL (ref 3.5–5.2)
Alkaline Phosphatase: 82 U/L (ref 39–117)
BUN: 15 mg/dL (ref 6–23)
CO2: 27 meq/L (ref 19–32)
Calcium: 9.3 mg/dL (ref 8.4–10.5)
Chloride: 104 meq/L (ref 96–112)
Creatinine, Ser: 0.92 mg/dL (ref 0.40–1.50)
GFR: 92.28 mL/min (ref >60.00)
Glucose, Bld: 86 mg/dL (ref 70–99)
Potassium: 4 meq/L (ref 3.5–5.1)
Sodium: 140 meq/L (ref 135–145)
Total Bilirubin: 0.9 mg/dL (ref 0.2–1.2)
Total Protein: 7.4 g/dL (ref 6.0–8.3)

## 2024-02-20 LAB — LIPID PANEL
Cholesterol: 223 mg/dL — ABNORMAL HIGH (ref 0–200)
HDL: 43.4 mg/dL (ref >39.00)
LDL Cholesterol: 159 mg/dL — ABNORMAL HIGH (ref 0–99)
NonHDL: 179.49
Total CHOL/HDL Ratio: 5
Triglycerides: 100 mg/dL (ref 0.0–149.0)
VLDL: 20 mg/dL (ref 0.0–40.0)

## 2024-02-20 LAB — URINALYSIS
Bilirubin Urine: NEGATIVE
Hgb urine dipstick: NEGATIVE
Ketones, ur: 15 — AB
Leukocytes,Ua: NEGATIVE
Nitrite: NEGATIVE
Specific Gravity, Urine: 1.02 (ref 1.000–1.030)
Urine Glucose: NEGATIVE
Urobilinogen, UA: 0.2 (ref 0.0–1.0)
pH: 6.5 (ref 5.0–8.0)

## 2024-02-20 LAB — VITAMIN B12: Vitamin B-12: 686 pg/mL (ref 211–911)

## 2024-02-20 LAB — VITAMIN D 25 HYDROXY (VIT D DEFICIENCY, FRACTURES): VITD: 38.66 ng/mL (ref 30.00–100.00)

## 2024-02-20 LAB — PSA: PSA: 4.87 ng/mL — ABNORMAL HIGH (ref 0.10–4.00)

## 2024-02-20 LAB — TSH: TSH: 1.11 u[IU]/mL (ref 0.35–5.50)

## 2024-02-20 NOTE — Assessment & Plan Note (Signed)
 On B12

## 2024-02-20 NOTE — Assessment & Plan Note (Signed)
 On Vit D

## 2024-02-20 NOTE — Assessment & Plan Note (Signed)
  We discussed age appropriate health related issues, including available/recomended screening tests and vaccinations. Labs were ordered to be later reviewed . All questions were answered. We discussed one or more of the following - seat belt use, use of sunscreen/sun exposure exercise, fall risk reduction, second hand smoke exposure, firearm use and storage, seat belt use, a need for adhering to healthy diet and exercise. Labs were ordered.  All questions were answered.  Pt had cologuard 2017, 2023 (-)

## 2024-02-20 NOTE — Assessment & Plan Note (Signed)
 Elevated BP at home. Start Losartan 100 mg daily

## 2024-02-20 NOTE — Progress Notes (Signed)
 Subjective:  Patient ID: Andrew Salazar, male    DOB: 06-28-1966  Age: 58 y.o. MRN: [id number]  CC: Annual Exam   HPI Andrew Salazar presents for a well exam Pt fell on 09/29/23 - sustained R rotator cuff tear. Dr Ranell Patrick will repair on 02/25/2024  Outpatient Medications Prior to Visit  Medication Sig Dispense Refill   Cholecalciferol (VITAMIN D3) 50 MCG (2000 UT) capsule Take 1 capsule (2,000 Units total) by mouth daily. 100 capsule 3   Cyanocobalamin (VITAMIN B-12) 1000 MCG SUBL Place 1 tablet (1,000 mcg total) under the tongue daily. 90 tablet 3   HYDROcodone-acetaminophen (NORCO/VICODIN) 5-325 MG tablet Take 1 tablet by mouth every 6 (six) hours as needed. 10 tablet 0   losartan (COZAAR) 100 MG tablet Take 1 tablet (100 mg total) by mouth daily. 90 tablet 3   No facility-administered medications prior to visit.    ROS: Review of Systems  Constitutional:  Negative for appetite change, fatigue and unexpected weight change.  HENT:  Negative for congestion, nosebleeds, sneezing, sore throat and trouble swallowing.   Eyes:  Negative for itching and visual disturbance.  Respiratory:  Negative for cough.   Cardiovascular:  Negative for chest pain, palpitations and leg swelling.  Gastrointestinal:  Negative for abdominal distention, blood in stool, diarrhea and nausea.  Genitourinary:  Negative for frequency and hematuria.  Musculoskeletal:  Positive for arthralgias. Negative for back pain, gait problem, joint swelling and neck pain.  Skin:  Negative for rash.  Neurological:  Negative for dizziness, tremors, speech difficulty and weakness.  Psychiatric/Behavioral:  Negative for agitation, dysphoric mood and sleep disturbance. The patient is not nervous/anxious.     Objective:  BP (!) 140/90   Pulse 78   Temp 98.6 F (37 C) (Oral)   Ht 5\' 1"  (1.549 m)   Wt 175 lb (79.4 kg)   SpO2 95%   BMI 33.07 kg/m   BP Readings from Last 3 Encounters:  02/20/24 (!) 140/90  11/25/23  138/84  09/29/23 (!) 179/95    Wt Readings from Last 3 Encounters:  02/20/24 175 lb (79.4 kg)  11/25/23 179 lb (81.2 kg)  09/29/23 170 lb (77.1 kg)    Physical Exam Constitutional:      General: He is not in acute distress.    Appearance: He is well-developed.     Comments: NAD  Eyes:     Conjunctiva/sclera: Conjunctivae normal.     Pupils: Pupils are equal, round, and reactive to light.  Neck:     Thyroid: No thyromegaly.     Vascular: No JVD.  Cardiovascular:     Rate and Rhythm: Normal rate and regular rhythm.     Heart sounds: Normal heart sounds. No murmur heard.    No friction rub. No gallop.  Pulmonary:     Effort: Pulmonary effort is normal. No respiratory distress.     Breath sounds: Normal breath sounds. No wheezing or rales.  Chest:     Chest wall: No tenderness.  Abdominal:     General: Bowel sounds are normal. There is no distension.     Palpations: Abdomen is soft. There is no mass.     Tenderness: There is no abdominal tenderness. There is no guarding or rebound.  Genitourinary:    Rectum: Normal. Guaiac result negative.  Musculoskeletal:        General: Tenderness present. Normal range of motion.     Cervical back: Normal range of motion.     Right lower leg: No  edema.     Left lower leg: No edema.  Lymphadenopathy:     Cervical: No cervical adenopathy.  Skin:    General: Skin is warm and dry.     Findings: No rash.  Neurological:     Mental Status: He is alert and oriented to person, place, and time.     Cranial Nerves: No cranial nerve deficit.     Motor: No abnormal muscle tone.     Coordination: Coordination normal.     Gait: Gait normal.     Deep Tendon Reflexes: Reflexes are normal and symmetric.  Psychiatric:        Behavior: Behavior normal.        Thought Content: Thought content normal.        Judgment: Judgment normal.   R shoulder w/pain Prostate 1+   Lab Results  Component Value Date   WBC 6.2 02/03/2023   HGB 14.9  02/03/2023   HCT 43.9 02/03/2023   PLT 216.0 02/03/2023   GLUCOSE 100 (H) 02/03/2023   CHOL 208 (H) 02/03/2023   TRIG 101.0 02/03/2023   HDL 45.30 02/03/2023   LDLDIRECT 144.2 11/06/2012   LDLCALC 142 (H) 02/03/2023   ALT 18 02/03/2023   AST 17 02/03/2023   NA 141 02/03/2023   K 4.2 02/03/2023   CL 106 02/03/2023   CREATININE 0.94 02/03/2023   BUN 19 02/03/2023   CO2 27 02/03/2023   TSH 0.95 02/03/2023   PSA 5.19 (H) 02/03/2023    DG Shoulder Right Result Date: 09/29/2023 CLINICAL DATA:  Right shoulder pain after a fall on Saturday. Decreased range of motion. EXAM: RIGHT SHOULDER - 2+ VIEW COMPARISON:  09/28/2008. FINDINGS: No acute osseous or joint abnormality. Degenerative changes in the right acromioclavicular joint. Visualized right chest is grossly unremarkable. IMPRESSION: 1. No acute findings. 2. Right acromioclavicular joint osteoarthritis. Electronically Signed   By: Leanna Battles M.D.   On: 09/29/2023 16:38    Assessment & Plan:   Problem List Items Addressed This Visit     Vitamin D deficiency   On Vit D      Relevant Orders   VITAMIN D 25 Hydroxy (Vit-D Deficiency, Fractures)   Well adult exam - Primary    We discussed age appropriate health related issues, including available/recomended screening tests and vaccinations. Labs were ordered to be later reviewed . All questions were answered. We discussed one or more of the following - seat belt use, use of sunscreen/sun exposure exercise, fall risk reduction, second hand smoke exposure, firearm use and storage, seat belt use, a need for adhering to healthy diet and exercise. Labs were ordered.  All questions were answered.  Pt had cologuard 2017, 2023 (-)       Relevant Orders   TSH   Urinalysis   CBC with Differential/Platelet   Lipid panel   PSA   Comprehensive metabolic panel   VITAMIN D 25 Hydroxy (Vit-D Deficiency, Fractures)   Vitamin B12   B12 deficiency   On B12      Relevant Orders    Vitamin B12   HTN (hypertension)   Elevated BP at home. Start Losartan 100 mg daily       Rotator cuff tear   Pt fell on 09/29/23 - sustained R rotator cuff tear. Dr Ranell Patrick will repair on 02/25/2024         No orders of the defined types were placed in this encounter.     Follow-up: No follow-ups on file.  Alex  Necie Wilcoxson, MD

## 2024-02-20 NOTE — Assessment & Plan Note (Signed)
 Pt fell on 09/29/23 - sustained R rotator cuff tear. Dr Ranell Patrick will repair on 02/25/2024

## 2024-02-24 ENCOUNTER — Encounter: Payer: Self-pay | Admitting: Internal Medicine

## 2024-06-20 ENCOUNTER — Encounter: Payer: Self-pay | Admitting: Internal Medicine

## 2024-07-15 ENCOUNTER — Ambulatory Visit: Admitting: Internal Medicine

## 2024-07-23 ENCOUNTER — Ambulatory Visit: Admitting: Internal Medicine

## 2024-07-23 ENCOUNTER — Encounter: Payer: Self-pay | Admitting: Internal Medicine

## 2024-07-23 VITALS — BP 130/80 | HR 52 | Temp 97.9°F | Ht 61.0 in | Wt 173.0 lb

## 2024-07-23 DIAGNOSIS — I10 Essential (primary) hypertension: Secondary | ICD-10-CM | POA: Diagnosis not present

## 2024-07-23 DIAGNOSIS — S46011D Strain of muscle(s) and tendon(s) of the rotator cuff of right shoulder, subsequent encounter: Secondary | ICD-10-CM

## 2024-07-23 DIAGNOSIS — E559 Vitamin D deficiency, unspecified: Secondary | ICD-10-CM

## 2024-07-23 DIAGNOSIS — R972 Elevated prostate specific antigen [PSA]: Secondary | ICD-10-CM

## 2024-07-23 DIAGNOSIS — E538 Deficiency of other specified B group vitamins: Secondary | ICD-10-CM

## 2024-07-23 LAB — CBC WITH DIFFERENTIAL/PLATELET
Basophils Absolute: 0 K/uL (ref 0.0–0.1)
Basophils Relative: 0.9 % (ref 0.0–3.0)
Eosinophils Absolute: 0.4 K/uL (ref 0.0–0.7)
Eosinophils Relative: 6.4 % — ABNORMAL HIGH (ref 0.0–5.0)
HCT: 44.6 % (ref 39.0–52.0)
Hemoglobin: 14.8 g/dL (ref 13.0–17.0)
Lymphocytes Relative: 39.5 % (ref 12.0–46.0)
Lymphs Abs: 2.2 K/uL (ref 0.7–4.0)
MCHC: 33.3 g/dL (ref 30.0–36.0)
MCV: 86.4 fl (ref 78.0–100.0)
Monocytes Absolute: 0.3 K/uL (ref 0.1–1.0)
Monocytes Relative: 5.1 % (ref 3.0–12.0)
Neutro Abs: 2.7 K/uL (ref 1.4–7.7)
Neutrophils Relative %: 48.1 % (ref 43.0–77.0)
Platelets: 202 K/uL (ref 150.0–400.0)
RBC: 5.16 Mil/uL (ref 4.22–5.81)
RDW: 14.5 % (ref 11.5–15.5)
WBC: 5.5 K/uL (ref 4.0–10.5)

## 2024-07-23 LAB — COMPREHENSIVE METABOLIC PANEL WITH GFR
ALT: 17 U/L (ref 0–53)
AST: 21 U/L (ref 0–37)
Albumin: 4.5 g/dL (ref 3.5–5.2)
Alkaline Phosphatase: 82 U/L (ref 39–117)
BUN: 15 mg/dL (ref 6–23)
CO2: 28 meq/L (ref 19–32)
Calcium: 9.5 mg/dL (ref 8.4–10.5)
Chloride: 105 meq/L (ref 96–112)
Creatinine, Ser: 0.92 mg/dL (ref 0.40–1.50)
GFR: 92.01 mL/min (ref >60.00)
Glucose, Bld: 92 mg/dL (ref 70–99)
Potassium: 4.2 meq/L (ref 3.5–5.1)
Sodium: 140 meq/L (ref 135–145)
Total Bilirubin: 0.7 mg/dL (ref 0.2–1.2)
Total Protein: 7.8 g/dL (ref 6.0–8.3)

## 2024-07-23 LAB — VITAMIN D 25 HYDROXY (VIT D DEFICIENCY, FRACTURES): VITD: 38.93 ng/mL (ref 30.00–100.00)

## 2024-07-23 LAB — VITAMIN B12: Vitamin B-12: 751 pg/mL (ref 211–911)

## 2024-07-23 NOTE — Assessment & Plan Note (Signed)
 On B12

## 2024-07-23 NOTE — Assessment & Plan Note (Signed)
 On Vit D

## 2024-07-23 NOTE — Assessment & Plan Note (Signed)
 BP on pt's monitor 127/90, our BP cuff 130/80 On Losartan  100 mg/d

## 2024-07-23 NOTE — Assessment & Plan Note (Signed)
In PT 

## 2024-07-23 NOTE — Progress Notes (Signed)
 Subjective:  Patient ID: Andrew Salazar, male    DOB: May 17, 1966  Age: 58 y.o. MRN: [id number]  CC: Medical Management of Chronic Issues   HPI [name] presents for HTN, Vit D and Vit B12 def   Outpatient Medications Prior to Visit  Medication Sig Dispense Refill   Cholecalciferol (VITAMIN D3) 50 MCG (2000 UT) capsule Take 1 capsule (2,000 Units total) by mouth daily. 100 capsule 3   Cyanocobalamin  (VITAMIN B-12) 1000 MCG SUBL Place 1 tablet (1,000 mcg total) under the tongue daily. 90 tablet 3   HYDROcodone -acetaminophen  (NORCO/VICODIN) 5-325 MG tablet Take 1 tablet by mouth every 6 (six) hours as needed. 10 tablet 0   losartan  (COZAAR ) 100 MG tablet Take 1 tablet (100 mg total) by mouth daily. 90 tablet 3   No facility-administered medications prior to visit.    ROS: Review of Systems  Constitutional:  Positive for fatigue. Negative for appetite change and unexpected weight change.  HENT:  Negative for congestion, nosebleeds, sneezing, sore throat and trouble swallowing.   Eyes:  Negative for itching and visual disturbance.  Respiratory:  Negative for cough.   Cardiovascular:  Negative for chest pain, palpitations and leg swelling.  Gastrointestinal:  Negative for abdominal distention, blood in stool, diarrhea and nausea.  Genitourinary:  Negative for frequency and hematuria.  Musculoskeletal:  Positive for arthralgias. Negative for back pain, gait problem, joint swelling and neck pain.  Skin:  Negative for rash.  Neurological:  Negative for dizziness, tremors, speech difficulty and weakness.  Psychiatric/Behavioral:  Negative for agitation, dysphoric mood and sleep disturbance. The patient is not nervous/anxious.     Objective:  BP 130/80   Pulse (!) 52   Temp 97.9 F (36.6 C) (Oral)   Ht 5' 1 (1.549 m)   Wt 173 lb (78.5 kg)   SpO2 99%   BMI 32.69 kg/m   BP Readings from Last 3 Encounters:  07/23/24 130/80  02/20/24 (!) 140/90  11/25/23 138/84    Wt  Readings from Last 3 Encounters:  07/23/24 173 lb (78.5 kg)  02/20/24 175 lb (79.4 kg)  11/25/23 179 lb (81.2 kg)    Physical Exam Constitutional:      General: He is not in acute distress.    Appearance: Normal appearance. He is well-developed.     Comments: NAD  Eyes:     Conjunctiva/sclera: Conjunctivae normal.     Pupils: Pupils are equal, round, and reactive to light.  Neck:     Thyroid : No thyromegaly.     Vascular: No JVD.  Cardiovascular:     Rate and Rhythm: Normal rate and regular rhythm.     Heart sounds: Normal heart sounds. No murmur heard.    No friction rub. No gallop.  Pulmonary:     Effort: Pulmonary effort is normal. No respiratory distress.     Breath sounds: Normal breath sounds. No wheezing or rales.  Chest:     Chest wall: No tenderness.  Abdominal:     General: Bowel sounds are normal. There is no distension.     Palpations: Abdomen is soft. There is no mass.     Tenderness: There is no abdominal tenderness. There is no guarding or rebound.  Musculoskeletal:        General: No tenderness. Normal range of motion.     Cervical back: Normal range of motion.  Lymphadenopathy:     Cervical: No cervical adenopathy.  Skin:    General: Skin is warm and dry.  Findings: No rash.  Neurological:     Mental Status: He is alert and oriented to person, place, and time.     Cranial Nerves: No cranial nerve deficit.     Motor: No abnormal muscle tone.     Coordination: Coordination normal.     Gait: Gait normal.     Deep Tendon Reflexes: Reflexes are normal and symmetric.  Psychiatric:        Behavior: Behavior normal.        Thought Content: Thought content normal.        Judgment: Judgment normal.   R shoulder w/pain BP on pt's monitor 127/90, our BP cuff 130/80  Lab Results  Component Value Date   WBC 5.4 02/20/2024   HGB 15.0 02/20/2024   HCT 44.4 02/20/2024   PLT 225.0 02/20/2024   GLUCOSE 86 02/20/2024   CHOL 223 (H) 02/20/2024   TRIG 100.0  02/20/2024   HDL 43.40 02/20/2024   LDLDIRECT 144.2 11/06/2012   LDLCALC 159 (H) 02/20/2024   ALT 17 02/20/2024   AST 21 02/20/2024   NA 140 02/20/2024   K 4.0 02/20/2024   CL 104 02/20/2024   CREATININE 0.92 02/20/2024   BUN 15 02/20/2024   CO2 27 02/20/2024   TSH 1.11 02/20/2024   PSA 4.87 (H) 02/20/2024    DG Shoulder Right Result Date: 09/29/2023 CLINICAL DATA:  Right shoulder pain after a fall on Saturday. Decreased range of motion. EXAM: RIGHT SHOULDER - 2+ VIEW COMPARISON:  09/28/2008. FINDINGS: No acute osseous or joint abnormality. Degenerative changes in the right acromioclavicular joint. Visualized right chest is grossly unremarkable. IMPRESSION: 1. No acute findings. 2. Right acromioclavicular joint osteoarthritis. Electronically Signed   By: Newell Eke M.D.   On: 09/29/2023 16:38    Assessment & Plan:   Problem List Items Addressed This Visit     B12 deficiency   On B12      Relevant Orders   Vitamin B12   Elevated PSA measurement   Relevant Orders   PSA, total and free   HTN (hypertension) - Primary   BP on pt's monitor 127/90, our BP cuff 130/80 On Losartan  100 mg/d      Relevant Orders   PSA, total and free   Comprehensive metabolic panel with GFR   Vitamin B12   VITAMIN D  25 Hydroxy (Vit-D Deficiency, Fractures)   CBC with Differential/Platelet   Rotator cuff tear   In PT      Vitamin D  deficiency   On Vit D      Relevant Orders   VITAMIN D  25 Hydroxy (Vit-D Deficiency, Fractures)      No orders of the defined types were placed in this encounter.     Follow-up: Return in about 6 months (around 01/23/2025) for Wellness Exam.  Marolyn Noel, MD

## 2024-07-26 LAB — PSA, TOTAL AND FREE
PSA, % Free: 15 % — ABNORMAL LOW (ref >25)
PSA, Free: 0.9 ng/mL
PSA, Total: 6 ng/mL — ABNORMAL HIGH (ref <=4.0)

## 2024-07-27 ENCOUNTER — Other Ambulatory Visit: Payer: Self-pay | Admitting: Internal Medicine

## 2024-07-27 ENCOUNTER — Ambulatory Visit: Payer: Self-pay | Admitting: Internal Medicine

## 2024-07-27 DIAGNOSIS — R972 Elevated prostate specific antigen [PSA]: Secondary | ICD-10-CM

## 2025-01-28 ENCOUNTER — Telehealth: Payer: Self-pay

## 2025-01-28 ENCOUNTER — Other Ambulatory Visit: Payer: Self-pay

## 2025-01-28 MED ORDER — LOSARTAN POTASSIUM 100 MG PO TABS: 100.0000 mg | ORAL_TABLET | Freq: Every day | ORAL | 3 refills | Status: AC

## 2025-01-28 NOTE — Telephone Encounter (Signed)
 Copied from CRM [id number]. Topic: Clinical - Medication Refill >> Jan 28, 2025  9:37 AM Robinson H wrote: Medication: losartan  (COZAAR ) 100 MG tablet  Has the patient contacted their pharmacy? No, refills expired on 11/2024 per bottle (Agent: If no, request that the patient contact the pharmacy for the refill. If patient does not wish to contact the pharmacy document the reason why and proceed with request.) (Agent: If yes, when and what did the pharmacy advise?)  This is the patient's preferred pharmacy:  [id number] - [place], [place] - [address] [place] [address] Phone: [phone] Fax: [phone]  Is this the correct pharmacy for this prescription? Yes If no, delete pharmacy and type the correct one.   Has the prescription been filled recently? No  Is the patient out of the medication? No  Has the patient been seen for an appointment in the last year OR does the patient have an upcoming appointment? Yes  Can we respond through MyChart? Yes  Agent: Please be advised that Rx refills may take up to 3 business days. We ask that you follow-up with your pharmacy.

## 2025-01-28 NOTE — Telephone Encounter (Signed)
Medication has been re-prescribed.

## 2025-02-22 ENCOUNTER — Encounter: Admitting: Internal Medicine
